# Patient Record
Sex: Female | Born: 1990 | Race: White | Hispanic: No | Marital: Single | Smoking: Current every day smoker
Health system: Southern US, Community
[De-identification: ages and names within clinical notes are randomized; demographics above are authoritative.]

## PROBLEM LIST (undated history)

## (undated) ENCOUNTER — Inpatient Hospital Stay (HOSPITAL_COMMUNITY): Payer: Self-pay

## (undated) DIAGNOSIS — O034 Incomplete spontaneous abortion without complication: Principal | ICD-10-CM

## (undated) DIAGNOSIS — F988 Other specified behavioral and emotional disorders with onset usually occurring in childhood and adolescence: Secondary | ICD-10-CM

## (undated) DIAGNOSIS — Z789 Other specified health status: Secondary | ICD-10-CM

## (undated) DIAGNOSIS — Z9889 Other specified postprocedural states: Secondary | ICD-10-CM

## (undated) HISTORY — DX: Incomplete spontaneous abortion without complication: O03.4

## (undated) HISTORY — DX: Other specified behavioral and emotional disorders with onset usually occurring in childhood and adolescence: F98.8

## (undated) HISTORY — PX: NO PAST SURGERIES: SHX2092

---

## 2012-02-11 ENCOUNTER — Inpatient Hospital Stay (HOSPITAL_COMMUNITY)
Admission: AD | Admit: 2012-02-11 | Discharge: 2012-02-11 | Disposition: A | Discharge status: Home / Self Care | Payer: Medicaid Other | Source: Ambulatory Visit | Attending: Obstetrics & Gynecology | Admitting: Obstetrics & Gynecology

## 2012-02-11 ENCOUNTER — Inpatient Hospital Stay (HOSPITAL_COMMUNITY): Payer: Medicaid Other

## 2012-02-11 ENCOUNTER — Encounter (HOSPITAL_COMMUNITY): Payer: Self-pay | Admitting: *Deleted

## 2012-02-11 DIAGNOSIS — O9989 Other specified diseases and conditions complicating pregnancy, childbirth and the puerperium: Secondary | ICD-10-CM

## 2012-02-11 DIAGNOSIS — R1032 Left lower quadrant pain: Secondary | ICD-10-CM | POA: Insufficient documentation

## 2012-02-11 DIAGNOSIS — R109 Unspecified abdominal pain: Secondary | ICD-10-CM

## 2012-02-11 DIAGNOSIS — O99891 Other specified diseases and conditions complicating pregnancy: Secondary | ICD-10-CM | POA: Insufficient documentation

## 2012-02-11 DIAGNOSIS — O26899 Other specified pregnancy related conditions, unspecified trimester: Secondary | ICD-10-CM

## 2012-02-11 HISTORY — DX: Other specified health status: Z78.9

## 2012-02-11 LAB — URINE MICROSCOPIC-ADD ON

## 2012-02-11 LAB — WET PREP, GENITAL
Clue Cells Wet Prep HPF POC: NONE SEEN
Trich, Wet Prep: NONE SEEN

## 2012-02-11 LAB — CBC
HCT: 35.3 % — ABNORMAL LOW (ref 36.0–46.0)
MCV: 85.9 fL (ref 78.0–100.0)
RBC: 4.11 MIL/uL (ref 3.87–5.11)
WBC: 10 10*3/uL (ref 4.0–10.5)

## 2012-02-11 LAB — URINALYSIS, ROUTINE W REFLEX MICROSCOPIC
Bilirubin Urine: NEGATIVE
Glucose, UA: NEGATIVE mg/dL
Specific Gravity, Urine: 1.015 (ref 1.005–1.030)
pH: 7.5 (ref 5.0–8.0)

## 2012-02-11 LAB — POCT PREGNANCY, URINE: Preg Test, Ur: POSITIVE — AB

## 2012-02-11 LAB — HCG, QUANTITATIVE, PREGNANCY: hCG, Beta Chain, Quant, S: 18698 m[IU]/mL — ABNORMAL HIGH (ref <5)

## 2012-02-11 NOTE — MAU Note (Signed)
Patient has a letter indicating [facility] talked with Dr. Su Hilt at [facility] and was instructed to come to MAU.

## 2012-02-11 NOTE — MAU Provider Note (Signed)
Chief Complaint: Abdominal Pain, Nausea and Emesis    SUBJECTIVE HPI: Heidi Delgado is a 22 y.o. G2P0010 at [redacted]w[redacted]d by LMP who [facility] where they did [place] and could not see fetal heart so sent here for further evaluation. Has had lower abdominal cramping pain for 2 weeks and nausea with occasional vomiting for a month. Two wks ago had episode of spotting dark blood.   Past Medical History  Diagnosis Date  . No pertinent past medical history    OB History    Grav Para Term Preterm Abortions TAB SAB Ect Mult Living   2              # Outc Date GA Lbr Len/2nd Wgt Sex Del Anes PTL Lv   1 CUR            2 GRA              Past Surgical History  Procedure Date  . No past surgeries    History   Social History  . Marital Status: Single    Spouse Name: N/A    Number of Children: N/A  . Years of Education: N/A   Occupational History  . Not on file.   Social History Main Topics  . Smoking status: Current Every Day Smoker -- 0.5 packs/day    Types: Cigarettes  . Smokeless tobacco: Not on file  . Alcohol Use: No  . Drug Use: No  . Sexually Active: Yes   Other Topics Concern  . Not on file   Social History Narrative  . No narrative on file   No current facility-administered medications on file prior to encounter.   No current outpatient prescriptions on file prior to encounter.   Allergies  Allergen Reactions  . Codeine Nausea And Vomiting    ROS: Pertinent items in HPI  OBJECTIVE Blood pressure 129/84, pulse 103, temperature 98.7 F (37.1 C), temperature source Oral, resp. rate 16, height 4' 11.5" (1.511 m), weight 105 lb 6.4 oz (47.809 kg), last menstrual period 12/05/2011, SpO2 100.00%. GENERAL: Well-developed, well-nourished female in no acute distress.  HEENT: Normocephalic HEART: normal rate RESP: normal effort ABDOMEN: Soft, non-tender EXTREMITIES: Nontender, no edema NEURO: Alert and oriented SPECULUM EXAM: NEFG, physiologic discharge,  no blood noted, cervix clean BIMANUAL: cervix L/C; uterus 4-6 wk size, no adnexal tenderness or masses  LAB RESULTS Results for orders placed during the hospital encounter of 02/11/12 (from the past 24 hour(s))  URINALYSIS, ROUTINE W REFLEX MICROSCOPIC     Status: Abnormal   Collection Time   02/11/12  5:45 PM      Component Value Range   Color, Urine YELLOW  YELLOW   APPearance CLOUDY (*) CLEAR   Specific Gravity, Urine 1.015  1.005 - 1.030   pH 7.5  5.0 - 8.0   Glucose, UA NEGATIVE  NEGATIVE mg/dL   Hgb urine dipstick TRACE (*) NEGATIVE   Bilirubin Urine NEGATIVE  NEGATIVE   Ketones, ur NEGATIVE  NEGATIVE mg/dL   Protein, ur NEGATIVE  NEGATIVE mg/dL   Urobilinogen, UA 0.2  0.0 - 1.0 mg/dL   Nitrite NEGATIVE  NEGATIVE   Leukocytes, UA NEGATIVE  NEGATIVE  URINE MICROSCOPIC-ADD ON     Status: Abnormal   Collection Time   02/11/12  5:45 PM      Component Value Range   Squamous Epithelial / LPF RARE  RARE   WBC, UA 0-2  <3 WBC/hpf   RBC / HPF 0-2  <  3 RBC/hpf   Bacteria, UA FEW (*) RARE  POCT PREGNANCY, URINE     Status: Abnormal   Collection Time   02/11/12  6:01 PM      Component Value Range   Preg Test, Ur POSITIVE (*) NEGATIVE  CBC     Status: Abnormal   Collection Time   02/11/12  6:35 PM      Component Value Range   WBC 10.0  4.0 - 10.5 K/uL   RBC 4.11  3.87 - 5.11 MIL/uL   Hemoglobin 12.0  12.0 - 15.0 g/dL   HCT 40.9 (*) 81.1 - 91.4 %   MCV 85.9  78.0 - 100.0 fL   MCH 29.2  26.0 - 34.0 pg   MCHC 34.0  30.0 - 36.0 g/dL   RDW 78.2  95.6 - 21.3 %   Platelets 202  150 - 400 K/uL  ABO/RH     Status: Normal (Preliminary result)   Collection Time   02/11/12  6:35 PM      Component Value Range   ABO/RH(D) A POS    HCG, QUANTITATIVE, PREGNANCY     Status: Abnormal   Collection Time   02/11/12  6:35 PM      Component Value Range   hCG, Beta Chain, Quant, S 08657 (*) <5 mIU/mL  WET PREP, GENITAL     Status: Abnormal   Collection Time   02/11/12  6:40 PM      Component Value  Range   Yeast Wet Prep HPF POC NONE SEEN  NONE SEEN   Trich, Wet Prep NONE SEEN  NONE SEEN   Clue Cells Wet Prep HPF POC NONE SEEN  NONE SEEN   WBC, Wet Prep HPF POC MANY (*) NONE SEEN    IMAGING   MAU COURSE  ASSESSMENT No diagnosis found.  PLAN Discharge home    Medication List     As of 02/11/2012  7:54 PM    ASK your doctor about these medications         acetaminophen 500 MG tablet   Commonly known as: TYLENOL   Take 500 mg by mouth every 6 (six) hours as needed. pain      ibuprofen 200 MG tablet   Commonly known as: ADVIL,MOTRIN   Take 200 mg by mouth every 6 (six) hours as needed. pain          Danae Orleans, CNM 02/11/2012  6:32 PM  Awaiting ultrasound results. Care assumed by [facility] FNP at 2000  *RADIOLOGY REPORT*  Clinical Data: Left lower quadrant pain.  OBSTETRIC <14 WK ULTRASOUND  Technique: Transabdominal ultrasound was performed for evaluation  of the gestation as well as the maternal uterus and adnexal  regions.  Comparison: None.  Intrauterine gestational sac: Visualized  Yolk sac: Visualized  Embryo: Visualized  Cardiac Activity: Not visualized  CRL: 3.3 mm 6 w 0 d [place] EDC: 10/06/2012  Maternal uterus/Adnexae:  Left-sided corpus luteum cyst.  IMPRESSION:  Early intrauterine pregnancy. Heart activity is not noted at the  present time which may be related to the early gestational age.  Correlation with serial beta HCGs and follow-up sonogram in 7-10  days is recommended (sooner if clinically indicated).   Follow up ultrasound in 2 weeks for viability, return sooner for problems.  Discussed findings with the patient and plan of care. Patient voices understanding.

## 2012-02-11 NOTE — MAU Note (Signed)
Patient states she she was seen [facility] today and had a positive pregnancy test. States she has been having left lower abdominal pain that radiates to the lower abdomen for about 2 weeks. Has had nausea with occasional vomiting for about 6 weeks but 3 episodes today. Has bleeding about 2 weeks ago but none now.

## 2012-02-11 NOTE — MAU Note (Signed)
C/o nausea since beginning of pregnancy. Reports abd pain and cramping on and off for the past 2 weeks. Went to [facility] told her to f/u here for hormone level.

## 2012-02-12 LAB — GC/CHLAMYDIA PROBE AMP
CT Probe RNA: NEGATIVE
GC Probe RNA: NEGATIVE

## 2012-04-22 ENCOUNTER — Encounter (HOSPITAL_COMMUNITY): Payer: Self-pay | Admitting: Emergency Medicine

## 2012-04-22 ENCOUNTER — Emergency Department (HOSPITAL_COMMUNITY)
Admission: EM | Admit: 2012-04-22 | Discharge: 2012-04-22 | Discharge status: Against Medical Advice | Payer: Self-pay | Attending: Emergency Medicine | Admitting: Emergency Medicine

## 2012-04-22 DIAGNOSIS — Y990 Civilian activity done for income or pay: Secondary | ICD-10-CM | POA: Insufficient documentation

## 2012-04-22 DIAGNOSIS — S0993XA Unspecified injury of face, initial encounter: Secondary | ICD-10-CM | POA: Insufficient documentation

## 2012-04-22 DIAGNOSIS — Y9289 Other specified places as the place of occurrence of the external cause: Secondary | ICD-10-CM | POA: Insufficient documentation

## 2012-04-22 DIAGNOSIS — S199XXA Unspecified injury of neck, initial encounter: Secondary | ICD-10-CM

## 2012-04-22 DIAGNOSIS — Y9389 Activity, other specified: Secondary | ICD-10-CM | POA: Insufficient documentation

## 2012-04-22 DIAGNOSIS — W010XXA Fall on same level from slipping, tripping and stumbling without subsequent striking against object, initial encounter: Secondary | ICD-10-CM | POA: Insufficient documentation

## 2012-04-22 NOTE — ED Notes (Signed)
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

## 2012-04-22 NOTE — ED Provider Notes (Signed)
The patient left the emergency department before myself or any of my assistants could perform an appropriate history and physical for this patient.  She left the emergency department without informing staff.  Lyanne Co, MD 04/22/12 [phone]

## 2013-08-30 ENCOUNTER — Encounter (HOSPITAL_COMMUNITY): Payer: Self-pay

## 2013-08-30 ENCOUNTER — Inpatient Hospital Stay (HOSPITAL_COMMUNITY): Payer: Medicaid Other

## 2013-08-30 ENCOUNTER — Inpatient Hospital Stay (HOSPITAL_COMMUNITY)
Admission: AD | Admit: 2013-08-30 | Discharge: 2013-08-30 | Disposition: A | Discharge status: Home / Self Care | Payer: Self-pay | Source: Ambulatory Visit | Attending: Obstetrics & Gynecology | Admitting: Obstetrics & Gynecology

## 2013-08-30 DIAGNOSIS — O4690 Antepartum hemorrhage, unspecified, unspecified trimester: Secondary | ICD-10-CM

## 2013-08-30 DIAGNOSIS — O209 Hemorrhage in early pregnancy, unspecified: Secondary | ICD-10-CM

## 2013-08-30 DIAGNOSIS — R109 Unspecified abdominal pain: Secondary | ICD-10-CM | POA: Insufficient documentation

## 2013-08-30 DIAGNOSIS — O26859 Spotting complicating pregnancy, unspecified trimester: Secondary | ICD-10-CM | POA: Insufficient documentation

## 2013-08-30 DIAGNOSIS — O99330 Smoking (tobacco) complicating pregnancy, unspecified trimester: Secondary | ICD-10-CM | POA: Insufficient documentation

## 2013-08-30 LAB — CBC
HCT: 39.2 % (ref 36.0–46.0)
HEMOGLOBIN: 13.9 g/dL (ref 12.0–15.0)
MCH: 29.6 pg (ref 26.0–34.0)
MCHC: 35.5 g/dL (ref 30.0–36.0)
MCV: 83.6 fL (ref 78.0–100.0)
PLATELETS: 163 10*3/uL (ref 150–400)
RBC: 4.69 MIL/uL (ref 3.87–5.11)
RDW: 12.9 % (ref 11.5–15.5)
WBC: 7.7 10*3/uL (ref 4.0–10.5)

## 2013-08-30 LAB — URINALYSIS, ROUTINE W REFLEX MICROSCOPIC
BILIRUBIN URINE: NEGATIVE
Glucose, UA: NEGATIVE mg/dL
Hgb urine dipstick: NEGATIVE
KETONES UR: NEGATIVE mg/dL
Leukocytes, UA: NEGATIVE
NITRITE: NEGATIVE
PH: 7.5 (ref 5.0–8.0)
Protein, ur: NEGATIVE mg/dL
Specific Gravity, Urine: 1.01 (ref 1.005–1.030)
Urobilinogen, UA: 0.2 mg/dL (ref 0.0–1.0)

## 2013-08-30 LAB — HCG, QUANTITATIVE, PREGNANCY: HCG, BETA CHAIN, QUANT, S: 92219 m[IU]/mL — AB (ref <5)

## 2013-08-30 LAB — WET PREP, GENITAL
Trich, Wet Prep: NONE SEEN
Yeast Wet Prep HPF POC: NONE SEEN

## 2013-08-30 LAB — POCT PREGNANCY, URINE: Preg Test, Ur: POSITIVE — AB

## 2013-08-30 NOTE — MAU Provider Note (Signed)
History     CSN: [id number]  Arrival date and time: 08/30/13 1424   First Provider Initiated Contact with Patient 08/30/13 1522      Chief Complaint  Patient presents with  . Vaginal Bleeding   HPI Heidi Delgado 23 y.o. G2P0 at [redacted]w[redacted]d presents to MAU with one month of intermittent spotting with cramping.  She took an HPT one month ago that was positive and shortly after that began having the described symptoms.  One week ago she had a PRT at [facility] Department.  The spotting is less now than previous.  She did notice a small amt yesterday but has not seen any today.  She had cramping yesterday also rated at 5/10.  Tylenol was helpful for this.  She notices it more with being still and less so when she is moving around.  She is having nausea which is worse after taking PNV and occasionally vomits with this.  OB History   Grav Para Term Preterm Abortions TAB SAB Ect Mult Living   2             OB history is significant for one miscarriage at around 10 weeks.    Past Medical History  Diagnosis Date  . No pertinent past medical history     Past Surgical History  Procedure Laterality Date  . No past surgeries      Family History  Problem Relation Age of Onset  . Other Neg Hx   . Cancer Other   . CAD Other   . Stroke Other   . Hypertension Other     History  Substance Use Topics  . Smoking status: Current Every Day Smoker -- 0.50 packs/day    Types: Cigarettes  . Smokeless tobacco: Not on file  . Alcohol Use: No     Comment: occ    Allergies:  Allergies  Allergen Reactions  . Codeine Shortness Of Breath and Nausea And Vomiting  . Ivp Dye [Iodinated Diagnostic Agents]     Prescriptions prior to admission  Medication Sig Dispense Refill  . acetaminophen (TYLENOL) 500 MG tablet Take 500 mg by mouth every 6 (six) hours as needed. pain      . Prenatal Vit-Fe Fumarate-FA (PRENATAL MULTIVITAMIN) TABS tablet Take 1 tablet by mouth daily at 12 noon.        Review of  Systems  Constitutional: Negative for fever and chills.  HENT: Positive for congestion. Negative for sore throat.   Respiratory: Negative for cough, shortness of breath and wheezing.   Cardiovascular: Negative for chest pain and palpitations.  Gastrointestinal: Positive for heartburn, nausea, vomiting and abdominal pain. Negative for diarrhea and constipation.  Genitourinary: Negative for dysuria, urgency and frequency.  Skin: Positive for itching. Negative for rash.  Neurological: Positive for weakness and headaches. Negative for dizziness, tingling and seizures.  Psychiatric/Behavioral: Negative for depression, suicidal ideas and substance abuse.   Physical Exam   Blood pressure 108/63, pulse 80, temperature 98.2 F (36.8 C), height  (1.549 m), weight 58.06 kg (128 lb), last menstrual period 06/12/2013, unknown if currently breastfeeding.  Physical Exam  Constitutional: She is oriented to person, place, and time. She appears well-developed and well-nourished. No distress.  HENT:  Head: Normocephalic and atraumatic.  Eyes: EOM are normal.  Neck: Normal range of motion.  Cardiovascular: Normal rate, regular rhythm and normal heart sounds.  Exam reveals no gallop and no friction rub.   No murmur heard. Respiratory: Breath sounds normal.  GI: Soft. Bowel sounds  are normal. She exhibits no distension. There is no tenderness.  Genitourinary:  Mod amt of thin, white, frothy discharge Cervix is pink, smooth No CMT, adnexal tenderness Uterus is gravid  Musculoskeletal: Normal range of motion.  Neurological: She is alert and oriented to person, place, and time.  Skin: Skin is warm and dry.  Psychiatric: She has a normal mood and affect.   Results for orders placed during the hospital encounter of 08/30/13 (from the past 24 hour(s))  URINALYSIS, ROUTINE W REFLEX MICROSCOPIC     Status: Abnormal   Collection Time    08/30/13  2:40 PM      Result Value Ref Range   Color, Urine  YELLOW  YELLOW   APPearance CLOUDY (*) CLEAR   Specific Gravity, Urine 1.010  1.005 - 1.030   pH 7.5  5.0 - 8.0   Glucose, UA NEGATIVE  NEGATIVE mg/dL   Hgb urine dipstick NEGATIVE  NEGATIVE   Bilirubin Urine NEGATIVE  NEGATIVE   Ketones, ur NEGATIVE  NEGATIVE mg/dL   Protein, ur NEGATIVE  NEGATIVE mg/dL   Urobilinogen, UA 0.2  0.0 - 1.0 mg/dL   Nitrite NEGATIVE  NEGATIVE   Leukocytes, UA NEGATIVE  NEGATIVE  POCT PREGNANCY, URINE     Status: Abnormal   Collection Time    08/30/13  2:45 PM      Result Value Ref Range   Preg Test, Ur POSITIVE (*) NEGATIVE  WET PREP, GENITAL     Status: Abnormal   Collection Time    08/30/13  3:35 PM      Result Value Ref Range   Yeast Wet Prep HPF POC NONE SEEN  NONE SEEN   Trich, Wet Prep NONE SEEN  NONE SEEN   Clue Cells Wet Prep HPF POC FEW (*) NONE SEEN   WBC, Wet Prep HPF POC MODERATE (*) NONE SEEN  CBC     Status: None   Collection Time    08/30/13  3:42 PM      Result Value Ref Range   WBC 7.7  4.0 - 10.5 K/uL   RBC 4.69  3.87 - 5.11 MIL/uL   Hemoglobin 13.9  12.0 - 15.0 g/dL   HCT 16.1  09.6 - 04.5 %   MCV 83.6  78.0 - 100.0 fL   MCH 29.6  26.0 - 34.0 pg   MCHC 35.5  30.0 - 36.0 g/dL   RDW 40.9  81.1 - 91.4 %   Platelets 163  150 - 400 K/uL  HCG, QUANTITATIVE, PREGNANCY     Status: Abnormal   Collection Time    08/30/13  3:42 PM      Result Value Ref Range   hCG, Beta Chain, Quant, S 92219 (*) <5 mIU/mL   US Ob Comp Less 14 Wks  08/30/2013   CLINICAL DATA:  Abnormal bleeding  EXAM: OBSTETRIC <14 WK ULTRASOUND  TECHNIQUE: Transabdominal ultrasound was performed for evaluation of the gestation as well as the maternal uterus and adnexal regions.  COMPARISON:  None.  FINDINGS: Intrauterine gestational sac: Visualized/normal in shape.  Yolk sac:  Visualized  Embryo:  Visualized  Cardiac Activity: Visualize  Heart Rate: 163 bpm  MSD:   mm    w     d  CRL:   32.3  mm   10 w 1 d                  [place] EDC: 03/27/2014  Maternal uterus/adnexae:  No subchorionic hemorrhage. No adnexal  or ovarian masses. No free fluid.  IMPRESSION: Ten week 1 day intrauterine pregnancy with fetal heart rate 163 beats per min. No acute maternal findings.   Electronically Signed   By: Charlett Nose M.D.   On: 08/30/2013 16:19    MAU Course  Procedures  U/S, CBC, HCG MDM Labs and u/s consistent with IUP progressing  Assessment and Plan  A: IUP, first trimester P: Discharge to home PNV daily Obtain [facility] asap Return to MAU for emergency  Bertram Denver 08/30/2013, 3:25 PM

## 2013-08-30 NOTE — MAU Note (Signed)
Patient reports she went to the clinic last week and was told she is around [redacted] weeks pregnant. She has been experiencing red or brown light spotting on and off for about a month. She is also experiencing occasional cramping and "shooting" pains in the pelvic region.

## 2013-08-30 NOTE — Discharge Instructions (Signed)

## 2013-08-31 LAB — GC/CHLAMYDIA PROBE AMP
CT Probe RNA: NEGATIVE
GC Probe RNA: NEGATIVE

## 2013-10-17 LAB — OB RESULTS CONSOLE GC/CHLAMYDIA
CHLAMYDIA, DNA PROBE: NEGATIVE
Gonorrhea: NEGATIVE

## 2013-10-17 LAB — OB RESULTS CONSOLE HEPATITIS B SURFACE ANTIGEN: HEP B S AG: NEGATIVE

## 2013-10-17 LAB — OB RESULTS CONSOLE ANTIBODY SCREEN: ANTIBODY SCREEN: NEGATIVE

## 2013-10-17 LAB — OB RESULTS CONSOLE HIV ANTIBODY (ROUTINE TESTING): HIV: NONREACTIVE

## 2013-10-17 LAB — OB RESULTS CONSOLE ABO/RH: RH Type: POSITIVE

## 2013-10-17 LAB — OB RESULTS CONSOLE RUBELLA ANTIBODY, IGM: Rubella: IMMUNE

## 2013-10-17 LAB — OB RESULTS CONSOLE RPR: RPR: NONREACTIVE

## 2013-10-18 ENCOUNTER — Other Ambulatory Visit: Payer: Self-pay | Admitting: Obstetrics and Gynecology

## 2013-10-18 DIAGNOSIS — N6320 Unspecified lump in the left breast, unspecified quadrant: Principal | ICD-10-CM

## 2013-10-18 DIAGNOSIS — N6325 Unspecified lump in the left breast, overlapping quadrants: Secondary | ICD-10-CM

## 2013-10-25 ENCOUNTER — Encounter (INDEPENDENT_AMBULATORY_CARE_PROVIDER_SITE_OTHER): Payer: Self-pay

## 2013-10-25 ENCOUNTER — Ambulatory Visit
Admission: RE | Admit: 2013-10-25 | Discharge: 2013-10-25 | Disposition: A | Discharge status: Home / Self Care | Payer: Medicaid Other | Source: Ambulatory Visit | Attending: Obstetrics and Gynecology | Admitting: Obstetrics and Gynecology

## 2013-10-25 DIAGNOSIS — N6325 Unspecified lump in the left breast, overlapping quadrants: Secondary | ICD-10-CM

## 2013-10-25 DIAGNOSIS — N6320 Unspecified lump in the left breast, unspecified quadrant: Principal | ICD-10-CM

## 2013-11-06 ENCOUNTER — Encounter (HOSPITAL_COMMUNITY): Payer: Self-pay

## 2014-01-05 NOTE — L&D Delivery Note (Signed)
Delivery Note Pt progressed to complete and pushed well.  At 11:46 PM a viable female was delivered via Vaginal, Spontaneous Delivery (Presentation: vtx; Occiput Anterior).  APGAR: 9, 9; weight pending.   Placenta status: Intact, Spontaneous.  Cord: 3 vessels with the following complications: None.  Anesthesia: Epidural  Episiotomy: None Lacerations: None Suture Repair: none Est. Blood Loss (mL): 300  Mom to postpartum.  Baby to Couplet care / Skin to Skin.  Karinna Beadles D 03/27/2014, 1:02 AM

## 2014-02-28 LAB — OB RESULTS CONSOLE GBS: GBS: POSITIVE

## 2014-03-21 ENCOUNTER — Encounter (HOSPITAL_COMMUNITY): Payer: Self-pay | Admitting: *Deleted

## 2014-03-21 ENCOUNTER — Telehealth (HOSPITAL_COMMUNITY): Payer: Self-pay | Admitting: *Deleted

## 2014-03-21 NOTE — Telephone Encounter (Signed)
Preadmission screen  

## 2014-03-26 ENCOUNTER — Inpatient Hospital Stay (HOSPITAL_COMMUNITY): Payer: Medicaid Other | Admitting: Anesthesiology

## 2014-03-26 ENCOUNTER — Encounter (HOSPITAL_COMMUNITY): Payer: Self-pay | Admitting: *Deleted

## 2014-03-26 ENCOUNTER — Inpatient Hospital Stay (HOSPITAL_COMMUNITY)
Admission: AD | Admit: 2014-03-26 | Discharge: 2014-03-28 | DRG: 775 | Disposition: A | Discharge status: Home / Self Care | Payer: Medicaid Other | Source: Ambulatory Visit | Attending: Obstetrics and Gynecology | Admitting: Obstetrics and Gynecology

## 2014-03-26 DIAGNOSIS — O99824 Streptococcus B carrier state complicating childbirth: Principal | ICD-10-CM | POA: Diagnosis present

## 2014-03-26 DIAGNOSIS — Z3A39 39 weeks gestation of pregnancy: Secondary | ICD-10-CM | POA: Diagnosis present

## 2014-03-26 DIAGNOSIS — Z3483 Encounter for supervision of other normal pregnancy, third trimester: Secondary | ICD-10-CM | POA: Diagnosis present

## 2014-03-26 DIAGNOSIS — O4290 Premature rupture of membranes, unspecified as to length of time between rupture and onset of labor, unspecified weeks of gestation: Secondary | ICD-10-CM | POA: Diagnosis present

## 2014-03-26 LAB — CBC
HEMATOCRIT: 40.9 % (ref 36.0–46.0)
Hemoglobin: 14 g/dL (ref 12.0–15.0)
MCH: 28.7 pg (ref 26.0–34.0)
MCHC: 34.2 g/dL (ref 30.0–36.0)
MCV: 83.8 fL (ref 78.0–100.0)
Platelets: 136 10*3/uL — ABNORMAL LOW (ref 150–400)
RBC: 4.88 MIL/uL (ref 3.87–5.11)
RDW: 13.8 % (ref 11.5–15.5)
WBC: 10.7 10*3/uL — ABNORMAL HIGH (ref 4.0–10.5)

## 2014-03-26 LAB — TYPE AND SCREEN
ABO/RH(D): A POS
Antibody Screen: NEGATIVE

## 2014-03-26 LAB — POCT FERN TEST: POCT FERN TEST: POSITIVE

## 2014-03-26 MED ORDER — EPHEDRINE 5 MG/ML INJ
10.0000 mg | INTRAVENOUS | Status: DC | PRN
  Filled 2014-03-26: qty 2

## 2014-03-26 MED ORDER — DIPHENHYDRAMINE HCL 50 MG/ML IJ SOLN: 12.5000 mg | INTRAMUSCULAR | Status: DC | PRN

## 2014-03-26 MED ORDER — CITRIC ACID-SODIUM CITRATE 334-500 MG/5ML PO SOLN: 30.0000 mL | ORAL | Status: DC | PRN

## 2014-03-26 MED ORDER — BUTORPHANOL TARTRATE 1 MG/ML IJ SOLN
1.0000 mg | Freq: Once | INTRAMUSCULAR | Status: AC
  Administered 2014-03-26: 1 mg via INTRAVENOUS
  Filled 2014-03-26: qty 1

## 2014-03-26 MED ORDER — LACTATED RINGERS IV SOLN: 500.0000 mL | Freq: Once | INTRAVENOUS | Status: DC

## 2014-03-26 MED ORDER — BUTORPHANOL TARTRATE 1 MG/ML IJ SOLN: 1.0000 mg | INTRAMUSCULAR | Status: DC | PRN

## 2014-03-26 MED ORDER — OXYTOCIN 40 UNITS IN LACTATED RINGERS INFUSION - SIMPLE MED: 62.5000 mL/h | INTRAVENOUS | Status: DC

## 2014-03-26 MED ORDER — DEXTROSE 5 % IV SOLN
2.5000 10*6.[IU] | INTRAVENOUS | Status: DC
  Administered 2014-03-26 (×3): 2.5 10*6.[IU] via INTRAVENOUS
  Filled 2014-03-26 (×7): qty 2.5

## 2014-03-26 MED ORDER — LACTATED RINGERS IV SOLN: 500.0000 mL | INTRAVENOUS | Status: DC | PRN

## 2014-03-26 MED ORDER — FENTANYL 2.5 MCG/ML BUPIVACAINE 1/10 % EPIDURAL INFUSION (WH - ANES)
14.0000 mL/h | INTRAMUSCULAR | Status: DC | PRN
  Administered 2014-03-26 (×3): 14 mL/h via EPIDURAL
  Filled 2014-03-26 (×2): qty 125

## 2014-03-26 MED ORDER — TERBUTALINE SULFATE 1 MG/ML IJ SOLN: 0.2500 mg | Freq: Once | INTRAMUSCULAR | Status: AC | PRN

## 2014-03-26 MED ORDER — LACTATED RINGERS IV SOLN
INTRAVENOUS | Status: DC
  Administered 2014-03-26 (×2): via INTRAVENOUS

## 2014-03-26 MED ORDER — OXYCODONE-ACETAMINOPHEN 5-325 MG PO TABS: 2.0000 | ORAL_TABLET | ORAL | Status: DC | PRN

## 2014-03-26 MED ORDER — LIDOCAINE HCL (PF) 1 % IJ SOLN
INTRAMUSCULAR | Status: DC | PRN
  Administered 2014-03-26 (×2): 5 mL

## 2014-03-26 MED ORDER — PHENYLEPHRINE 40 MCG/ML (10ML) SYRINGE FOR IV PUSH (FOR BLOOD PRESSURE SUPPORT)
80.0000 ug | PREFILLED_SYRINGE | INTRAVENOUS | Status: DC | PRN
  Filled 2014-03-26: qty 2

## 2014-03-26 MED ORDER — BUTORPHANOL TARTRATE 1 MG/ML IJ SOLN
2.0000 mg | Freq: Once | INTRAMUSCULAR | Status: AC
  Administered 2014-03-26: 2 mg via INTRAVENOUS
  Filled 2014-03-26: qty 2

## 2014-03-26 MED ORDER — OXYTOCIN 40 UNITS IN LACTATED RINGERS INFUSION - SIMPLE MED
1.0000 m[IU]/min | INTRAVENOUS | Status: DC
  Administered 2014-03-26: 2 m[IU]/min via INTRAVENOUS
  Filled 2014-03-26: qty 1000

## 2014-03-26 MED ORDER — LIDOCAINE HCL (PF) 1 % IJ SOLN
30.0000 mL | INTRAMUSCULAR | Status: DC | PRN
Start: 2014-03-26 — End: 2014-03-27
  Filled 2014-03-26: qty 30

## 2014-03-26 MED ORDER — PENICILLIN G POTASSIUM 5000000 UNITS IJ SOLR
5.0000 10*6.[IU] | Freq: Once | INTRAVENOUS | Status: AC
  Administered 2014-03-26: 5 10*6.[IU] via INTRAVENOUS
  Filled 2014-03-26: qty 5

## 2014-03-26 MED ORDER — PHENYLEPHRINE 40 MCG/ML (10ML) SYRINGE FOR IV PUSH (FOR BLOOD PRESSURE SUPPORT)
80.0000 ug | PREFILLED_SYRINGE | INTRAVENOUS | Status: DC | PRN
  Filled 2014-03-26: qty 20
  Filled 2014-03-26: qty 2

## 2014-03-26 MED ORDER — OXYCODONE-ACETAMINOPHEN 5-325 MG PO TABS: 1.0000 | ORAL_TABLET | ORAL | Status: DC | PRN

## 2014-03-26 MED ORDER — OXYTOCIN BOLUS FROM INFUSION
500.0000 mL | INTRAVENOUS | Status: DC
  Administered 2014-03-26: 500 mL via INTRAVENOUS

## 2014-03-26 MED ORDER — ONDANSETRON HCL 4 MG/2ML IJ SOLN
4.0000 mg | Freq: Four times a day (QID) | INTRAMUSCULAR | Status: DC | PRN
  Administered 2014-03-26: 4 mg via INTRAVENOUS
  Filled 2014-03-26: qty 2

## 2014-03-26 NOTE — Progress Notes (Signed)
Comfortable with epidural Afeb, VSS FHT- Cat I, ctx q 1-3 min on 14 mu/min pitocin VE-3/80/-1, vtx Continue pitocin and PCN, monitor progress

## 2014-03-26 NOTE — Anesthesia Preprocedure Evaluation (Signed)

## 2014-03-26 NOTE — H&P (Signed)
Heidi Delgado is a 24 y.o. female, G2 P0010, EGA 39+ weeks with EDC 3-22 presenting for eval of leaking fluid since about 0730.  Eval in MAU confirmed ROM, irreg ctx, pt admitted and started on pitocin and PCN.  Prenatal care essentially uncomplicated, see prenatal records for complete history.  Maternal Medical History:  Reason for admission: Rupture of membranes and contractions.   Contractions: Frequency: irregular.   Perceived severity is mild.    Fetal activity: Perceived fetal activity is normal.    Prenatal complications: no prenatal complications   OB History    Gravida Para Term Preterm AB TAB SAB Ectopic Multiple Living   2    1  1         Past Medical History  Diagnosis Date  . No pertinent past medical history   . ADD (attention deficit disorder)    Past Surgical History  Procedure Laterality Date  . No past surgeries     Family History: family history includes CAD in her other; Cancer in her other; Hypertension in her other; Stroke in her other. There is no history of Other. Social History:  reports that she quit smoking about 5 months ago. She does not have any smokeless tobacco history on file. She reports that she does not drink alcohol or use illicit drugs.   Prenatal Transfer Tool  Maternal Diabetes: No Genetic Screening: Normal Maternal Ultrasounds/Referrals: Normal Fetal Ultrasounds or other Referrals:  None Maternal Substance Abuse:  No Significant Maternal Medications:  None Significant Maternal Lab Results:  Lab values include: Group B Strep positive Other Comments:  None  Review of Systems  Respiratory: Negative.   Cardiovascular: Negative.     Dilation: Closed Effacement (%): 50 Station: -2 Exam by:: lee Blood pressure 112/69, pulse 80, temperature 98.1 F (36.7 C), temperature source Oral, resp. rate 18, height 5' (1.524 m), weight 78.019 kg (172 lb), last menstrual period 06/12/2013, unknown if currently breastfeeding. Maternal Exam:   Uterine Assessment: Contraction strength is mild.  Contraction frequency is irregular.   Abdomen: Patient reports no abdominal tenderness. Estimated fetal weight is 7 lbs.   Fetal presentation: vertex  Introitus: Normal vulva. Normal vagina.  Ferning test: positive.  Amniotic fluid character: clear.  Pelvis: adequate for delivery.   Cervix: Cervix evaluated by digital exam.     Fetal Exam Fetal Monitor Review: Mode: ultrasound.   Variability: moderate (6-25 bpm).   Pattern: accelerations present and no decelerations.    Fetal State Assessment: Category I - tracings are normal.     Physical Exam  Vitals reviewed. Constitutional: She appears well-developed and well-nourished.  Cardiovascular: Normal rate, regular rhythm and normal heart sounds.   No murmur heard. Respiratory: Effort normal and breath sounds normal. No respiratory distress. She has no wheezes.    Prenatal labs: ABO, Rh: A/Positive/-- (10/13 0000) Antibody: Negative (10/13 0000) Rubella: Immune (10/13 0000) RPR: Nonreactive (10/13 0000)  HBsAg: Negative (10/13 0000)  HIV: Non-reactive (10/13 0000)  GBS: Positive (02/24 0000)   Assessment/Plan: IUP at 39 weeks with ROM, irreg ctx, cervix not dilated.  Pt admitted, on pitocin augmentation, on PCN for GBS prophylaxis.  Will monitor progress, discussed this may be a lengthy process.     Rodriquez Thorner D 03/26/2014, 1:10 PM

## 2014-03-26 NOTE — MAU Note (Signed)
Pt states she has had irregular uc's during the night, no bleeding, started having gushes of fluid 1 1/2 hours ago, clear.  Pt had wet pants on arrival to MAU, clear fluid noted in bathroom floor.

## 2014-03-26 NOTE — Anesthesia Procedure Notes (Signed)
Epidural Patient location during procedure: OB Start time: 03/26/2014 3:17 PM  Staffing Anesthesiologist: Brayton CavesJACKSON, Heidi Delgado Performed by: anesthesiologist   Preanesthetic Checklist Completed: patient identified, site marked, surgical consent, pre-op evaluation, timeout performed, IV checked, risks and benefits discussed and monitors and equipment checked  Epidural Patient position: sitting Prep: site prepped and draped and DuraPrep Patient monitoring: continuous pulse ox and blood pressure Approach: midline Location: L3-L4 Injection technique: LOR air  Needle:  Needle type: Tuohy  Needle gauge: 17 G Needle length: 9 cm and 9 Needle insertion depth: 5 cm cm Catheter type: closed end flexible Catheter size: 19 Gauge Catheter at skin depth: 10 cm Test dose: negative  Assessment Events: blood not aspirated, injection not painful, no injection resistance, negative IV test and no paresthesia  Additional Notes Patient identified.  Risk benefits discussed including failed block, incomplete pain control, headache, nerve damage, paralysis, blood pressure changes, nausea, vomiting, reactions to medication both toxic or allergic, and postpartum back pain.  Patient expressed understanding and wished to proceed.  All questions were answered.  Sterile technique used throughout procedure and epidural site dressed with sterile barrier dressing. No paresthesia or other complications noted.The patient did not experience any signs of intravascular injection such as tinnitus or metallic taste in mouth nor signs of intrathecal spread such as rapid motor block. Please see nursing notes for vital signs.

## 2014-03-26 NOTE — Plan of Care (Signed)
Problem: Consults Goal: Birthing Suites Patient Information Press F2 to bring up selections list  Outcome: Completed/Met Date Met:  03/26/14  Pt > [redacted] weeks EGA and Other (specify with a note) SROM.

## 2014-03-27 ENCOUNTER — Encounter (HOSPITAL_COMMUNITY): Payer: Self-pay

## 2014-03-27 LAB — RPR: RPR Ser Ql: NONREACTIVE

## 2014-03-27 MED ORDER — SIMETHICONE 80 MG PO CHEW: 80.0000 mg | CHEWABLE_TABLET | ORAL | Status: DC | PRN

## 2014-03-27 MED ORDER — TETANUS-DIPHTH-ACELL PERTUSSIS 5-2.5-18.5 LF-MCG/0.5 IM SUSP: 0.5000 mL | Freq: Once | INTRAMUSCULAR | Status: DC

## 2014-03-27 MED ORDER — WITCH HAZEL-GLYCERIN EX PADS: 1.0000 "application " | MEDICATED_PAD | CUTANEOUS | Status: DC | PRN

## 2014-03-27 MED ORDER — METHYLERGONOVINE MALEATE 0.2 MG/ML IJ SOLN: 0.2000 mg | INTRAMUSCULAR | Status: DC | PRN

## 2014-03-27 MED ORDER — ONDANSETRON HCL 4 MG PO TABS: 4.0000 mg | ORAL_TABLET | ORAL | Status: DC | PRN

## 2014-03-27 MED ORDER — BENZOCAINE-MENTHOL 20-0.5 % EX AERO: 1.0000 "application " | INHALATION_SPRAY | CUTANEOUS | Status: DC | PRN

## 2014-03-27 MED ORDER — MAGNESIUM HYDROXIDE 400 MG/5ML PO SUSP: 30.0000 mL | ORAL | Status: DC | PRN

## 2014-03-27 MED ORDER — LANOLIN HYDROUS EX OINT: TOPICAL_OINTMENT | CUTANEOUS | Status: DC | PRN

## 2014-03-27 MED ORDER — DIPHENHYDRAMINE HCL 25 MG PO CAPS: 25.0000 mg | ORAL_CAPSULE | Freq: Four times a day (QID) | ORAL | Status: DC | PRN

## 2014-03-27 MED ORDER — ZOLPIDEM TARTRATE 5 MG PO TABS: 5.0000 mg | ORAL_TABLET | Freq: Every evening | ORAL | Status: DC | PRN

## 2014-03-27 MED ORDER — SENNOSIDES-DOCUSATE SODIUM 8.6-50 MG PO TABS
2.0000 | ORAL_TABLET | ORAL | Status: DC
  Filled 2014-03-27: qty 2

## 2014-03-27 MED ORDER — MEASLES, MUMPS & RUBELLA VAC ~~LOC~~ INJ
0.5000 mL | INJECTION | Freq: Once | SUBCUTANEOUS | Status: DC
  Filled 2014-03-27: qty 0.5

## 2014-03-27 MED ORDER — METHYLERGONOVINE MALEATE 0.2 MG PO TABS: 0.2000 mg | ORAL_TABLET | ORAL | Status: DC | PRN

## 2014-03-27 MED ORDER — ACETAMINOPHEN 325 MG PO TABS
650.0000 mg | ORAL_TABLET | ORAL | Status: DC | PRN
  Administered 2014-03-27: 650 mg via ORAL
  Filled 2014-03-27: qty 2

## 2014-03-27 MED ORDER — ONDANSETRON HCL 4 MG/2ML IJ SOLN
4.0000 mg | INTRAMUSCULAR | Status: DC | PRN
Start: 2014-03-27 — End: 2014-03-28

## 2014-03-27 MED ORDER — DIBUCAINE 1 % RE OINT: 1.0000 "application " | TOPICAL_OINTMENT | RECTAL | Status: DC | PRN

## 2014-03-27 MED ORDER — PRENATAL MULTIVITAMIN CH
1.0000 | ORAL_TABLET | Freq: Every day | ORAL | Status: DC
  Administered 2014-03-27 – 2014-03-28 (×2): 1 via ORAL
  Filled 2014-03-27 (×2): qty 1

## 2014-03-27 MED ORDER — OXYCODONE-ACETAMINOPHEN 5-325 MG PO TABS: 2.0000 | ORAL_TABLET | ORAL | Status: DC | PRN

## 2014-03-27 MED ORDER — IBUPROFEN 600 MG PO TABS
600.0000 mg | ORAL_TABLET | Freq: Four times a day (QID) | ORAL | Status: DC
  Administered 2014-03-27 – 2014-03-28 (×6): 600 mg via ORAL
  Filled 2014-03-27 (×6): qty 1

## 2014-03-27 MED ORDER — OXYCODONE-ACETAMINOPHEN 5-325 MG PO TABS: 1.0000 | ORAL_TABLET | ORAL | Status: DC | PRN

## 2014-03-27 NOTE — Progress Notes (Signed)
PPD #1 No problems Afeb, VSS Fundus firm, NT at U-1 Continue routine postpartum care 

## 2014-03-27 NOTE — Lactation Note (Addendum)
This note was copied from the chart of Heidi Delgado. Lactation Consultation Note  Patient Name: Heidi Pauline Goodnita Zelman ZOXWR'UToday's Date: 03/27/2014 Reason for consult: Follow-up assessment  Baby is 4418 hours old . 2nd LC visit . MBU RN assessed baby and she woke up. MBU RN assisted  Mom with latch and positioning. LC observed the feeding and noted multiply swallows and increased with breast compressions. Baby released at 13 mins , and still rooting so LC assisted with positioning and re- latched the Baby in football position. Increased  swallows noted and per mom the latch felt more comfortable. Baby still feeding at 5 mins.( LC also observed the previous latch )  LC reviewed basics. Breast massage , hand express, Before  latching and skin to skin feedings until the baby can stay awake for a feeding.    Maternal Data Has patient been taught Hand Expression?: Yes  Feeding Feeding Type: Breast Fed Length of feed:  (multiply swallows noted , per mom latched better , more comfortable )  LATCH Score/Interventions Latch: Grasps breast easily, tongue down, lips flanged, rhythmical sucking. Intervention(s): Adjust position;Assist with latch;Breast compression  Audible Swallowing: Spontaneous and intermittent Intervention(s): Hand expression  Type of Nipple: Everted at rest and after stimulation  Comfort (Breast/Nipple): Soft / non-tender     Hold (Positioning): Assistance needed to correctly position infant at breast and maintain latch. (with depth and positioning ) Intervention(s): Breastfeeding basics reviewed;Support Pillows;Position options;Skin to skin  LATCH Score: 9  Lactation Tools Discussed/Used WIC Program: No (per mom )   Consult Status Consult Status: Follow-up Date: 03/28/14 Follow-up type: In-patient    Kathrin Greathouseorio, Mitsuo Budnick Ann 03/27/2014, 6:03 PM

## 2014-03-27 NOTE — Lactation Note (Signed)
This note was copied from the chart of Heidi Pauline Goodnita Payeur. Lactation Consultation Note  Patient Name: Heidi Delgado Today's Date: 03/27/2014 Reason for consult: Initial assessment (mom encouraged to page with feeding cues )  Baby is 17 hours old , has been to the breast x 5 10 -15 mins , and attempts. Per mom last tried at 3 pm and the baby was sleepy.  1wet , 5 mec stools. LS - 6-8.  Mother informed of post-discharge support and given phone number to the lactation department,  including services for phone call assistance; out-patient appointments; and breastfeeding support group.  List of other breastfeeding resources in the community given in the handout. Encouraged mother to call  for problems or concerns related to breastfeeding.Mother informed of post-discharge support and given  phone number to the lactation department, including services for phone call assistance; out-patient appointments;  and breastfeeding support group. List of other breastfeeding resources in the community given in the handout.  Encouraged mother to call for problems or concerns related to breastfeeding.   Maternal Data    Feeding Feeding Type:  (per mom last attempted at 1530 , baby sleepy ) Length of feed: 0 min  LATCH Score/Interventions                Intervention(s): Breastfeeding basics reviewed     Lactation Tools Discussed/Used WIC Program: No (per mom )   Consult Status Consult Status: Follow-up Date: 03/27/14 Follow-up type: In-patient    Kathrin Greathouseorio, Jonathan Corpus Ann 03/27/2014, 5:05 PM

## 2014-03-27 NOTE — Progress Notes (Signed)
UR chart review completed.  

## 2014-03-27 NOTE — Anesthesia Postprocedure Evaluation (Signed)
Anesthesia Post Note  Patient: Heidi Delgado  Procedure(s) Performed: * No procedures listed *  Anesthesia type: Epidural  Patient location: Mother/Baby  Post pain: Pain level controlled  Post assessment: Post-op Vital signs reviewed  Last Vitals:  Filed Vitals:   03/27/14 0640  BP: 128/69  Pulse: 90  Temp: 37.1 C  Resp: 18    Post vital signs: Reviewed  Level of consciousness:alert  Complications: No apparent anesthesia complications

## 2014-03-27 NOTE — Progress Notes (Signed)
Patient tearful and anxious about breastfeeding and difficulties with Latch. Infant demonstrating late feeding cues, but easily consoled. Respositioned patient for comfort and to facilitate side lying latch. Offered comfort and emotional support with a good result. Patient was able to stop crying and participate in breastfeeding event. Father of baby here, and observed how to coach and support the patients breastfeeding needs at a later time. Infant latched for >15 minutes, patient verbalized comfort, less anxiety.

## 2014-03-28 MED ORDER — IBUPROFEN 600 MG PO TABS: 600.0000 mg | ORAL_TABLET | Freq: Four times a day (QID) | ORAL | Status: DC

## 2014-03-28 NOTE — Lactation Note (Signed)
This note was copied from the chart of Heidi Delgado Frangos. Lactation Consultation Note  Baby latched in football hold upon entering. Mother independently latched baby. Sucks and some swallows observed. Encouraged mother to massage her breast to keep baby active. Discussed how to unlatch, switching side and burping, cluster feeding. Reviewed engorgement care and Baby & Me booklet pp 24&25 to monitor voids/stools. Encouraged mother to attend support group and provided her w/ comfort gels. Mom encouraged to feed baby 8-12 times/24 hours and with feeding cues.    Patient Name: Heidi Delgado Obryan ZOXWR'UToday's Date: 03/28/2014 Reason for consult: Follow-up assessment   Maternal Data    Feeding Feeding Type: Breast Fed Length of feed: 20 min  LATCH Score/Interventions Latch: Grasps breast easily, tongue down, lips flanged, rhythmical sucking. (latched upon entering) Intervention(s): Breast massage  Audible Swallowing: A few with stimulation Intervention(s): Skin to skin;Hand expression Intervention(s): Alternate breast massage;Skin to skin  Type of Nipple: Everted at rest and after stimulation  Comfort (Breast/Nipple): Filling, red/small blisters or bruises, mild/mod discomfort  Problem noted: Mild/Moderate discomfort Interventions (Mild/moderate discomfort): Comfort gels  Hold (Positioning): No assistance needed to correctly position infant at breast. Intervention(s): Breastfeeding basics reviewed;Support Pillows;Skin to skin  LATCH Score: 8  Lactation Tools Discussed/Used     Consult Status Consult Status: Follow-up Date: 03/29/14 Follow-up type: In-patient    Dahlia ByesBerkelhammer, [facility] 03/28/2014, 10:23 AM

## 2014-03-28 NOTE — Progress Notes (Signed)
PPD #2 Doing well Afeb, VSS Fundus firm D/c home 

## 2014-03-28 NOTE — Discharge Summary (Signed)
Obstetric Discharge Summary Reason for Admission: rupture of membranes Prenatal Procedures: none Intrapartum Procedures: spontaneous vaginal delivery Postpartum Procedures: none Complications-Operative and Postpartum: none HEMOGLOBIN  Date Value Ref Range Status  03/26/2014 14.0 12.0 - 15.0 g/dL Final   HCT  Date Value Ref Range Status  03/26/2014 40.9 36.0 - 46.0 % Final    Physical Exam:  General: alert Lochia: appropriate Uterine Fundus: firm   Discharge Diagnoses: Term Pregnancy-delivered  Discharge Information: Date: 03/28/2014 Activity: pelvic rest Diet: routine Medications: Ibuprofen Condition: stable Instructions: refer to practice specific booklet Discharge to: home Follow-up Information    Follow up with Jullian Clayson D, MD. Schedule an appointment as soon as possible for a visit in 6 weeks.   Specialty:  Obstetrics and Gynecology   Contact information:   [address], [address] [place] [address] [phone]       Newborn Data: Live born female  Birth Weight: 7 lb 5.5 oz (3330 g) APGAR: 9, 9  Home with mother.  Zeriyah Wain D 03/28/2014, 8:09 AM

## 2014-03-28 NOTE — Progress Notes (Signed)
Admission nutrition screen triggered for unintentional weight loss > 10 lbs within the last month. PNR indicates a 5 lb difference in weights 3/15-3/21 and an overall weight gain of 36 lbs, which would be > IOM recommendations. Patients chart reviewed and assessed  for nutritional risk. Patient is determined to be at low nutrition  risk.   Elisabeth CaraKatherine Raney Antwine M.Odis LusterEd. R.D. LDN Neonatal Nutrition Support Specialist/RD III Pager [phone]

## 2014-03-28 NOTE — Discharge Instructions (Signed)
As per discharge pamphlet °

## 2014-04-02 ENCOUNTER — Inpatient Hospital Stay (HOSPITAL_COMMUNITY): Admission: RE | Admit: 2014-04-02 | Payer: Medicaid Other | Source: Ambulatory Visit

## 2017-10-20 ENCOUNTER — Encounter: Payer: Self-pay | Admitting: Obstetrics and Gynecology

## 2017-10-20 ENCOUNTER — Other Ambulatory Visit: Payer: Self-pay

## 2017-10-20 ENCOUNTER — Ambulatory Visit (HOSPITAL_COMMUNITY)
Admission: AD | Admit: 2017-10-20 | Discharge: 2017-10-20 | Disposition: A | Discharge status: Home / Self Care | Payer: Medicaid Other | Source: Ambulatory Visit | Attending: Obstetrics and Gynecology | Admitting: Obstetrics and Gynecology

## 2017-10-20 ENCOUNTER — Inpatient Hospital Stay (HOSPITAL_COMMUNITY): Payer: Medicaid Other | Admitting: Certified Registered Nurse Anesthetist

## 2017-10-20 ENCOUNTER — Encounter (HOSPITAL_COMMUNITY)
Admission: AD | Disposition: A | Discharge status: Home / Self Care | Payer: Self-pay | Source: Ambulatory Visit | Attending: Obstetrics and Gynecology

## 2017-10-20 ENCOUNTER — Other Ambulatory Visit: Payer: Self-pay | Admitting: Obstetrics and Gynecology

## 2017-10-20 ENCOUNTER — Encounter (HOSPITAL_COMMUNITY): Payer: Self-pay | Admitting: *Deleted

## 2017-10-20 DIAGNOSIS — Z9889 Other specified postprocedural states: Secondary | ICD-10-CM

## 2017-10-20 DIAGNOSIS — Z91041 Radiographic dye allergy status: Secondary | ICD-10-CM | POA: Diagnosis not present

## 2017-10-20 DIAGNOSIS — F988 Other specified behavioral and emotional disorders with onset usually occurring in childhood and adolescence: Secondary | ICD-10-CM | POA: Diagnosis not present

## 2017-10-20 DIAGNOSIS — Z8249 Family history of ischemic heart disease and other diseases of the circulatory system: Secondary | ICD-10-CM | POA: Diagnosis not present

## 2017-10-20 DIAGNOSIS — O021 Missed abortion: Secondary | ICD-10-CM | POA: Insufficient documentation

## 2017-10-20 DIAGNOSIS — Z87891 Personal history of nicotine dependence: Secondary | ICD-10-CM | POA: Insufficient documentation

## 2017-10-20 DIAGNOSIS — Z885 Allergy status to narcotic agent status: Secondary | ICD-10-CM | POA: Insufficient documentation

## 2017-10-20 DIAGNOSIS — O034 Incomplete spontaneous abortion without complication: Secondary | ICD-10-CM

## 2017-10-20 HISTORY — DX: Other specified postprocedural states: Z98.890

## 2017-10-20 HISTORY — PX: DILATION AND EVACUATION: SHX1459

## 2017-10-20 HISTORY — DX: Incomplete spontaneous abortion without complication: O03.4

## 2017-10-20 LAB — CBC
HCT: 41.2 % (ref 36.0–46.0)
HEMOGLOBIN: 14.3 g/dL (ref 12.0–15.0)
MCH: 29.1 pg (ref 26.0–34.0)
MCHC: 34.7 g/dL (ref 30.0–36.0)
MCV: 83.9 fL (ref 80.0–100.0)
PLATELETS: 193 10*3/uL (ref 150–400)
RBC: 4.91 MIL/uL (ref 3.87–5.11)
RDW: 12.8 % (ref 11.5–15.5)
WBC: 11.1 10*3/uL — AB (ref 4.0–10.5)
nRBC: 0 % (ref 0.0–0.2)

## 2017-10-20 LAB — TYPE AND SCREEN
ABO/RH(D): A POS
Antibody Screen: NEGATIVE

## 2017-10-20 SURGERY — DILATION AND EVACUATION, UTERUS
Anesthesia: General | Site: Vagina

## 2017-10-20 MED ORDER — MEPERIDINE HCL 25 MG/ML IJ SOLN: 6.2500 mg | INTRAMUSCULAR | Status: DC | PRN

## 2017-10-20 MED ORDER — OXYCODONE HCL 5 MG/5ML PO SOLN: 5.0000 mg | Freq: Once | ORAL | Status: DC | PRN

## 2017-10-20 MED ORDER — LIDOCAINE HCL (CARDIAC) PF 100 MG/5ML IV SOSY
PREFILLED_SYRINGE | INTRAVENOUS | Status: DC | PRN
  Administered 2017-10-20: 100 mg via INTRAVENOUS

## 2017-10-20 MED ORDER — ONDANSETRON HCL 4 MG/2ML IJ SOLN: 4.0000 mg | Freq: Once | INTRAMUSCULAR | Status: DC | PRN

## 2017-10-20 MED ORDER — SODIUM CHLORIDE 0.9 % IV SOLN
100.0000 mg | Freq: Once | INTRAVENOUS | Status: AC
  Administered 2017-10-20: 100 mg via INTRAVENOUS
  Filled 2017-10-20: qty 100

## 2017-10-20 MED ORDER — LACTATED RINGERS IV BOLUS
1000.0000 mL | Freq: Once | INTRAVENOUS | Status: AC
  Administered 2017-10-20: 1000 mL via INTRAVENOUS

## 2017-10-20 MED ORDER — FENTANYL CITRATE (PF) 100 MCG/2ML IJ SOLN
INTRAMUSCULAR | Status: DC | PRN
  Administered 2017-10-20 (×2): 50 ug via INTRAVENOUS

## 2017-10-20 MED ORDER — KETOROLAC TROMETHAMINE 30 MG/ML IJ SOLN
INTRAMUSCULAR | Status: AC
  Filled 2017-10-20: qty 1

## 2017-10-20 MED ORDER — MISOPROSTOL 200 MCG PO TABS
200.0000 ug | ORAL_TABLET | Freq: Once | ORAL | Status: AC
  Administered 2017-10-20: 200 ug via VAGINAL
  Filled 2017-10-20: qty 1

## 2017-10-20 MED ORDER — LIDOCAINE HCL (CARDIAC) PF 100 MG/5ML IV SOSY
PREFILLED_SYRINGE | INTRAVENOUS | Status: AC
  Filled 2017-10-20: qty 5

## 2017-10-20 MED ORDER — PROPOFOL 10 MG/ML IV BOLUS
INTRAVENOUS | Status: DC | PRN
  Administered 2017-10-20: 200 mg via INTRAVENOUS

## 2017-10-20 MED ORDER — OXYCODONE HCL 5 MG PO TABS: 5.0000 mg | ORAL_TABLET | Freq: Once | ORAL | Status: DC | PRN

## 2017-10-20 MED ORDER — LACTATED RINGERS IV SOLN
INTRAVENOUS | Status: DC
  Administered 2017-10-20 (×2): via INTRAVENOUS

## 2017-10-20 MED ORDER — ACETAMINOPHEN 325 MG PO TABS: 325.0000 mg | ORAL_TABLET | ORAL | Status: DC | PRN

## 2017-10-20 MED ORDER — SOD CITRATE-CITRIC ACID 500-334 MG/5ML PO SOLN
30.0000 mL | Freq: Once | ORAL | Status: AC
  Administered 2017-10-20: 30 mL via ORAL
  Filled 2017-10-20: qty 15

## 2017-10-20 MED ORDER — DEXAMETHASONE SODIUM PHOSPHATE 4 MG/ML IJ SOLN
INTRAMUSCULAR | Status: AC
  Filled 2017-10-20: qty 1

## 2017-10-20 MED ORDER — ONDANSETRON HCL 4 MG/2ML IJ SOLN
INTRAMUSCULAR | Status: DC | PRN
  Administered 2017-10-20: 4 mg via INTRAVENOUS

## 2017-10-20 MED ORDER — KETOROLAC TROMETHAMINE 30 MG/ML IJ SOLN
INTRAMUSCULAR | Status: DC | PRN
  Administered 2017-10-20: 30 mg via INTRAVENOUS

## 2017-10-20 MED ORDER — ACETAMINOPHEN 160 MG/5ML PO SOLN: 325.0000 mg | ORAL | Status: DC | PRN

## 2017-10-20 MED ORDER — PROPOFOL 10 MG/ML IV BOLUS
INTRAVENOUS | Status: AC
  Filled 2017-10-20: qty 20

## 2017-10-20 MED ORDER — MIDAZOLAM HCL 2 MG/2ML IJ SOLN
INTRAMUSCULAR | Status: DC | PRN
  Administered 2017-10-20: 2 mg via INTRAVENOUS

## 2017-10-20 MED ORDER — FENTANYL CITRATE (PF) 100 MCG/2ML IJ SOLN: 25.0000 ug | INTRAMUSCULAR | Status: DC | PRN

## 2017-10-20 MED ORDER — ONDANSETRON HCL 4 MG/2ML IJ SOLN
INTRAMUSCULAR | Status: AC
Start: 2017-10-20 — End: ?
  Filled 2017-10-20: qty 2

## 2017-10-20 MED ORDER — FENTANYL CITRATE (PF) 100 MCG/2ML IJ SOLN
INTRAMUSCULAR | Status: AC
  Filled 2017-10-20: qty 2

## 2017-10-20 MED ORDER — DEXAMETHASONE SODIUM PHOSPHATE 10 MG/ML IJ SOLN
INTRAMUSCULAR | Status: DC | PRN
  Administered 2017-10-20: 4 mg via INTRAVENOUS

## 2017-10-20 MED ORDER — LIDOCAINE HCL 2 % IJ SOLN
INTRAMUSCULAR | Status: DC | PRN
  Administered 2017-10-20: 20 mL

## 2017-10-20 MED ORDER — DOXYCYCLINE HYCLATE 100 MG PO TABS: 200.0000 mg | ORAL_TABLET | Freq: Once | ORAL | Status: DC

## 2017-10-20 MED ORDER — IBUPROFEN 600 MG PO TABS: 600.0000 mg | ORAL_TABLET | Freq: Four times a day (QID) | ORAL | 1 refills | Status: AC | PRN

## 2017-10-20 SURGICAL SUPPLY — 18 items
CATH ROBINSON RED A/P 16FR (CATHETERS) ×3 IMPLANT
DECANTER SPIKE VIAL GLASS SM (MISCELLANEOUS) ×3 IMPLANT
GLOVE BIO SURGEON STRL SZ 6.5 (GLOVE) ×2 IMPLANT
GLOVE BIO SURGEONS STRL SZ 6.5 (GLOVE) ×1
GLOVE BIOGEL PI IND STRL 7.0 (GLOVE) ×1 IMPLANT
GLOVE BIOGEL PI INDICATOR 7.0 (GLOVE) ×2
GOWN STRL REUS W/TWL LRG LVL3 (GOWN DISPOSABLE) ×6 IMPLANT
KIT BERKELEY 1ST TRIMESTER 3/8 (MISCELLANEOUS) ×3 IMPLANT
NS IRRIG 1000ML POUR BTL (IV SOLUTION) ×3 IMPLANT
PACK VAGINAL MINOR WOMEN LF (CUSTOM PROCEDURE TRAY) ×3 IMPLANT
PAD OB MATERNITY 4.3X12.25 (PERSONAL CARE ITEMS) ×3 IMPLANT
PAD PREP 24X48 CUFFED NSTRL (MISCELLANEOUS) ×3 IMPLANT
SET BERKELEY SUCTION TUBING (SUCTIONS) ×3 IMPLANT
TOWEL OR 17X24 6PK STRL BLUE (TOWEL DISPOSABLE) ×6 IMPLANT
VACURETTE 10 RIGID CVD (CANNULA) IMPLANT
VACURETTE 7MM CVD STRL WRAP (CANNULA) ×3 IMPLANT
VACURETTE 8 RIGID CVD (CANNULA) IMPLANT
VACURETTE 9 RIGID CVD (CANNULA) IMPLANT

## 2017-10-20 NOTE — H&P (Signed)
Heidi Delgado is an 27 y.o. female G3P1021 at 7 wk w incomplete SAB.  By LMP should be 14 weeks, but on [place] 7 wk with no FHTs.  D/w pt options for management.  Opts for D&C - d/w pt r/b/a  Pertinent Gynecological History:  OB History: G3, P1021 G1 SAB G2 SVD 7#5 3/16  No abn pap No STD  Menstrual History:  No LMP recorded. Patient is pregnant.    Past Medical History:  Diagnosis Date  . ADD (attention deficit disorder)   . Incomplete spontaneous abortion 10/20/2017  . No pertinent past medical history     Past Surgical History:  Procedure Laterality Date  . NO PAST SURGERIES      Family History  Problem Relation Age of Onset  . Other Neg Hx   . Cancer Other   . CAD Other   . Stroke Other   . Hypertension Other     Social History:  reports that she quit smoking about 4 years ago. She smoked 0.00 packs per day. She does not have any smokeless tobacco history on file. She reports that she does not drink alcohol or use drugs. single  Allergies:  Allergies  Allergen Reactions  . Codeine Shortness Of Breath and Nausea And Vomiting  . Vicodin [Hydrocodone-Acetaminophen] Shortness Of Breath and Nausea Only  . Ivp Dye [Iodinated Diagnostic Agents]     Medications Prior to Admission  Medication Sig Dispense Refill Last Dose  . acetaminophen (TYLENOL) 500 MG tablet Take 500 mg by mouth every 6 (six) hours as needed. pain   Past Month at Unknown time  . calcium carbonate (TUMS - DOSED IN MG ELEMENTAL CALCIUM) 500 MG chewable tablet Chew 1 tablet by mouth daily as needed for indigestion or heartburn.   Past Week at Unknown time  . ibuprofen (ADVIL,MOTRIN) 600 MG tablet Take 1 tablet (600 mg total) by mouth every 6 (six) hours. 30 tablet 0   . Prenatal Vit-Fe Fumarate-FA (PRENATAL MULTIVITAMIN) TABS tablet Take 1 tablet by mouth daily at 12 noon.   03/25/2014 at Unknown time    Review of Systems  Constitutional: Negative.   HENT: Negative.   Eyes: Negative.    Respiratory: Negative.   Cardiovascular: Negative.   Gastrointestinal: Negative.   Genitourinary: Negative.   Musculoskeletal: Negative.   Skin: Negative.   Neurological: Negative.   Psychiatric/Behavioral: Negative.     Blood pressure 122/67, pulse 85, temperature 98.3 F (36.8 C), temperature source Oral, resp. rate 17, height 5' (1.524 m), weight 70.5 kg, SpO2 100 %, unknown if currently breastfeeding. Physical Exam  Constitutional: She is oriented to person, place, and time. She appears well-developed and well-nourished.  HENT:  Head: Normocephalic and atraumatic.  Cardiovascular: Normal rate and regular rhythm.  Respiratory: Breath sounds normal. No respiratory distress. She has no wheezes.  GI: Soft. Bowel sounds are normal. She exhibits no distension. There is no tenderness.  Musculoskeletal: Normal range of motion.  Neurological: She is alert and oriented to person, place, and time.  Skin: Skin is warm and dry.  Psychiatric: She has a normal mood and affect. Her behavior is normal.   Blood type A+  [place] 7wk CRL, no FHTs  Assessment/Plan: 27to G3P1021 at 7+ no FHTs D&C Doxycycline for prophylaxis cytotec pv for cervical ripening D/w pt r/b/a of D&C - will proceed  Laraine Samet Bovard-Stuckert 10/20/2017, 3:11 PM

## 2017-10-20 NOTE — Anesthesia Procedure Notes (Signed)
Procedure Name: LMA Insertion Date/Time: 10/20/2017 4:46 PM Performed by: Elgie Congo, CRNA Pre-anesthesia Checklist: Patient identified, Emergency Drugs available, Suction available and Patient being monitored Patient Re-evaluated:Patient Re-evaluated prior to induction Oxygen Delivery Method: Circle system utilized Preoxygenation: Pre-oxygenation with 100% oxygen Induction Type: IV induction Ventilation: Mask ventilation without difficulty LMA: LMA inserted LMA Size: 3.0 Number of attempts: 1 Placement Confirmation: positive ETCO2 and breath sounds checked- equal and bilateral Tube secured with: Tape Dental Injury: Teeth and Oropharynx as per pre-operative assessment

## 2017-10-20 NOTE — Interval H&P Note (Signed)
History and Physical Interval Note:  10/20/2017 4:28 PM  [name]  has presented today for surgery, with the diagnosis of Heidi Delgado  The various methods of treatment have been discussed with the patient and family. After consideration of risks, benefits and other options for treatment, the patient has consented to  Procedure(s): DILATATION AND EVACUATION (N/A) as a surgical intervention .  The patient's history has been reviewed, patient examined, no change in status, stable for surgery.  I have reviewed the patient's chart and labs.  Questions were answered to the patient's satisfaction.     Aleyda Gindlesperger Bovard-Stuckert

## 2017-10-20 NOTE — MAU Note (Signed)
MAB, sent in for procedure, denies pain or bleeding.  States is doing ok.

## 2017-10-20 NOTE — Anesthesia Preprocedure Evaluation (Addendum)
Anesthesia Evaluation  Patient identified by MRN, date of birth, ID band Patient awake    Reviewed: Allergy & Precautions, H&P , NPO status , Patient's Chart, lab work & pertinent test results, reviewed documented beta blocker date and time   Airway Mallampati: I  TM Distance: >3 FB Neck ROM: full    Dental no notable dental hx. (+) Teeth Intact   Pulmonary Current Smoker, former smoker,    Pulmonary exam normal breath sounds clear to auscultation       Cardiovascular Exercise Tolerance: Good negative cardio ROS   Rhythm:regular Rate:Normal     Neuro/Psych PSYCHIATRIC DISORDERS negative neurological ROS     GI/Hepatic negative GI ROS, Neg liver ROS,   Endo/Other  negative endocrine ROS  Renal/GU negative Renal ROS  negative genitourinary   Musculoskeletal negative musculoskeletal ROS (+)   Abdominal   Peds negative pediatric ROS (+)  Hematology negative hematology ROS (+)   Anesthesia Other Findings   Reproductive/Obstetrics (+) Pregnancy                           Anesthesia Physical  Anesthesia Plan  ASA: II and emergent  Anesthesia Plan: General   Post-op Pain Management:    Induction: Intravenous  PONV Risk Score and Plan: 1 and Treatment may vary due to age or medical condition and Ondansetron  Airway Management Planned: LMA  Additional Equipment:   Intra-op Plan:   Post-operative Plan:   Informed Consent: I have reviewed the patients History and Physical, chart, labs and discussed the procedure including the risks, benefits and alternatives for the proposed anesthesia with the patient or authorized representative who has indicated his/her understanding and acceptance.   Dental Advisory Given  Plan Discussed with: CRNA, Surgeon and Anesthesiologist  Anesthesia Plan Comments: ( )      Anesthesia Quick Evaluation

## 2017-10-20 NOTE — Anesthesia Postprocedure Evaluation (Signed)
Anesthesia Post Note  Patient: Heidi Delgado  Procedure(s) Performed: DILATATION AND EVACUATION (N/A Vagina )     Patient location during evaluation: PACU Anesthesia Type: General Level of consciousness: awake and alert Pain management: pain level controlled Vital Signs Assessment: post-procedure vital signs reviewed and stable Respiratory status: spontaneous breathing, nonlabored ventilation, respiratory function stable and patient connected to nasal cannula oxygen Cardiovascular status: blood pressure returned to baseline and stable Postop Assessment: no apparent nausea or vomiting Anesthetic complications: no    Last Vitals:  Vitals:   10/20/17 1451  BP: 122/67  Pulse: 85  Resp: 17  Temp: 36.8 C  SpO2: 100%    Last Pain:  Vitals:   10/20/17 1451  TempSrc: Oral   Pain Goal:                 [name]

## 2017-10-20 NOTE — Brief Op Note (Signed)
10/20/2017  5:11 PM  PATIENT:  Heidi Delgado  27 y.o. female  PRE-OPERATIVE DIAGNOSIS:  Missed AB  POST-OPERATIVE DIAGNOSIS:  missed AB  PROCEDURE:  Procedure(s): DILATATION AND EVACUATION (N/A)  SURGEON:  Surgeon(s) and Role:    * Bovard-Stuckert, Liann Spaeth, MD - Primary  ANESTHESIA:   general by LMA  EBL:  50 mL uop 100cc IVF per anesthesia  BLOOD ADMINISTERED:none  DRAINS: none   LOCAL MEDICATIONS USED:  LIDOCAINE   SPECIMEN:  Source of Specimen:  POC  DISPOSITION OF SPECIMEN:  PATHOLOGY  COUNTS:  YES  TOURNIQUET:  * No tourniquets in log *  DICTATION: .Other Dictation: Dictation Number [id number]  PLAN OF CARE: Discharge to home after PACU  PATIENT DISPOSITION:  PACU - hemodynamically stable.   Delay start of Pharmacological VTE agent (>24hrs) due to surgical blood loss or risk of bleeding: not applicable

## 2017-10-20 NOTE — Discharge Instructions (Signed)
°  Post Anesthesia Home Care Instructions ° °Activity: °Get plenty of rest for the remainder of the day. A responsible individual must stay with you for 24 hours following the procedure.  °For the next 24 hours, DO NOT: °-Drive a car °-Operate machinery °-Drink alcoholic beverages °-Take any medication unless instructed by your physician °-Make any legal decisions or sign important papers. ° °Meals: °Start with liquid foods such as gelatin or soup. Progress to regular foods as tolerated. Avoid greasy, spicy, heavy foods. If nausea and/or vomiting occur, drink only clear liquids until the nausea and/or vomiting subsides. Call your physician if vomiting continues. ° °Special Instructions/Symptoms: °Your throat may feel dry or sore from the anesthesia or the breathing tube placed in your throat during surgery. If this causes discomfort, gargle with warm salt water. The discomfort should disappear within 24 hours. ° °If you had a scopolamine patch placed behind your ear for the management of post- operative nausea and/or vomiting: ° °1. The medication in the patch is effective for 72 hours, after which it should be removed.  Wrap patch in a tissue and discard in the trash. Wash hands thoroughly with soap and water. °2. You may remove the patch earlier than 72 hours if you experience unpleasant side effects which may include dry mouth, dizziness or visual disturbances. °3. Avoid touching the patch. Wash your hands with soap and water after contact with the patch. °  °DISCHARGE INSTRUCTIONS: D&C / D&E °The following instructions have been prepared to help you care for yourself upon your return home. °  °Personal hygiene: °• Use sanitary pads for vaginal drainage, not tampons. °• Shower the day after your procedure. °• NO tub baths, pools or Jacuzzis for 2-3 weeks. °• Wipe front to back after using the bathroom. ° °Activity and limitations: °• Do NOT drive or operate any equipment for 24 hours. The effects of anesthesia are  still present and drowsiness may result. °• Do NOT rest in bed all day. °• Walking is encouraged. °• Walk up and down stairs slowly. °• You may resume your normal activity in one to two days or as indicated by your physician. ° °Sexual activity: NO intercourse for at least 2 weeks after the procedure, or as indicated by your physician. ° °Diet: Eat a light meal as desired this evening. You may resume your usual diet tomorrow. ° °Return to work: You may resume your work activities in one to two days or as indicated by your doctor. ° °What to expect after your surgery: Expect to have vaginal bleeding/discharge for 2-3 days and spotting for up to 10 days. It is not unusual to have soreness for up to 1-2 weeks. You may have a slight burning sensation when you urinate for the first day. Mild cramps may continue for a couple of days. You may have a regular period in 2-6 weeks. ° °Call your doctor for any of the following: °• Excessive vaginal bleeding, saturating and changing one pad every hour. °• Inability to urinate 6 hours after discharge from hospital. °• Pain not relieved by pain medication. °• Fever of 100.4° F or greater. °• Unusual vaginal discharge or odor. ° ° Call for an appointment:  ° ° °Patient’s signature: ______________________ ° °Nurse’s signature ________________________ ° °Support person's signature_______________________ ° ° ° °

## 2017-10-20 NOTE — Op Note (Signed)
Heidi Delgado, Heidi Delgado MEDICAL RECORD ZO:[id number] ACCOUNT [id number] DATE OF BIRTH:Apr 21, 1990 FACILITY: [facility] LOCATION: [facility] PHYSICIAN:Graeden Bitner Hinton Rao, MD  OPERATIVE REPORT  DATE OF PROCEDURE:  10/20/2017  PREOPERATIVE DIAGNOSES:  Missed abortion.  POSTOPERATIVE DIAGNOSIS:  Missed abortion.  PROCEDURE:  Suction D and C.  SURGEON:  Sherian Rein, MD  ANESTHESIA:  General by LMA.  ESTIMATED BLOOD LOSS:  Approximately 100 mL.  URINE OUTPUT:  100 mL.  IV FLUIDS:  Per anesthesia.  COMPLICATIONS:  None.  PATHOLOGY:  Products of conception sent to pathology.  DESCRIPTION OF PROCEDURE:  After informed consent was reviewed with the patient and her partner including risks, benefits and alternatives of the procedure, she was transported to the operating room and placed on the table in supine position, then placed  in the [place] stirrups.  General anesthesia was induced and found to be adequate.  An LMA was placed.  Appropriate timeout was performed.  She was then prepped and draped in the normal sterile fashion.  Her bladder was sterilely drained.  Using an  open-sided speculum, her cervix was easily visualized, and a paracervical block with 20 mL of 2% lidocaine was placed.  Her uterus was noted to be retroverted.  Her cervix was dilated with Hegar dilators to 21, and a 7-French curette was placed into her  uterus with several passes.  The products of conception were removed from her cervix.  After several passes, appropriate tissue had been removed.  The curette was then removed.  The tenaculum was removed from her cervix, and bleeding was noted to be  within normal limits.  The speculum was removed.  The patient was returned to supine position, awakened in stable condition, transferred to the PACU.  Sponge, lap and needle counts were correct x2 per the operating staff.  LN/NUANCE  D:10/20/2017 T:10/20/2017 JOB:[id number]

## 2017-10-20 NOTE — Transfer of Care (Signed)
Immediate Anesthesia Transfer of Care Note  Patient: Heidi Delgado  Procedure(s) Performed: DILATATION AND EVACUATION (N/A Vagina )  Patient Location: PACU  Anesthesia Type:General  Level of Consciousness: awake, alert  and oriented  Airway & Oxygen Therapy: Patient Spontanous Breathing and Patient connected to nasal cannula oxygen  Post-op Assessment: Report given to RN, Post -op Vital signs reviewed and stable and Patient moving all extremities  Post vital signs: Reviewed and stable  Last Vitals:  Vitals Value Taken Time  BP    Temp    Pulse    Resp    SpO2      Last Pain:  Vitals:   10/20/17 1451  TempSrc: Oral         Complications: No apparent anesthesia complications

## 2017-10-21 ENCOUNTER — Encounter (HOSPITAL_COMMUNITY): Payer: Self-pay | Admitting: Obstetrics and Gynecology

## 2018-02-27 ENCOUNTER — Other Ambulatory Visit: Payer: Self-pay

## 2018-02-27 ENCOUNTER — Emergency Department (HOSPITAL_COMMUNITY)
Admission: EM | Admit: 2018-02-27 | Discharge: 2018-02-27 | Disposition: A | Discharge status: Home / Self Care | Payer: Medicaid Other | Attending: Emergency Medicine | Admitting: Emergency Medicine

## 2018-02-27 ENCOUNTER — Encounter (HOSPITAL_COMMUNITY): Payer: Self-pay | Admitting: Emergency Medicine

## 2018-02-27 ENCOUNTER — Emergency Department (HOSPITAL_COMMUNITY): Payer: Medicaid Other

## 2018-02-27 DIAGNOSIS — F17210 Nicotine dependence, cigarettes, uncomplicated: Secondary | ICD-10-CM | POA: Diagnosis not present

## 2018-02-27 DIAGNOSIS — Z79899 Other long term (current) drug therapy: Secondary | ICD-10-CM | POA: Insufficient documentation

## 2018-02-27 DIAGNOSIS — R1031 Right lower quadrant pain: Secondary | ICD-10-CM | POA: Diagnosis present

## 2018-02-27 DIAGNOSIS — R319 Hematuria, unspecified: Secondary | ICD-10-CM | POA: Insufficient documentation

## 2018-02-27 DIAGNOSIS — N200 Calculus of kidney: Secondary | ICD-10-CM | POA: Insufficient documentation

## 2018-02-27 LAB — CBC WITH DIFFERENTIAL/PLATELET
Abs Immature Granulocytes: 0.07 10*3/uL (ref 0.00–0.07)
BASOS ABS: 0 10*3/uL (ref 0.0–0.1)
Basophils Relative: 0 %
EOS ABS: 0.1 10*3/uL (ref 0.0–0.5)
EOS PCT: 1 %
HCT: 47.4 % — ABNORMAL HIGH (ref 36.0–46.0)
Hemoglobin: 15.9 g/dL — ABNORMAL HIGH (ref 12.0–15.0)
Immature Granulocytes: 1 %
LYMPHS ABS: 2.9 10*3/uL (ref 0.7–4.0)
Lymphocytes Relative: 26 %
MCH: 28.4 pg (ref 26.0–34.0)
MCHC: 33.5 g/dL (ref 30.0–36.0)
MCV: 84.6 fL (ref 80.0–100.0)
MONO ABS: 0.7 10*3/uL (ref 0.1–1.0)
Monocytes Relative: 6 %
NRBC: 0 % (ref 0.0–0.2)
Neutro Abs: 7.2 10*3/uL (ref 1.7–7.7)
Neutrophils Relative %: 66 %
Platelets: 224 10*3/uL (ref 150–400)
RBC: 5.6 MIL/uL — ABNORMAL HIGH (ref 3.87–5.11)
RDW: 12.5 % (ref 11.5–15.5)
WBC: 11 10*3/uL — ABNORMAL HIGH (ref 4.0–10.5)

## 2018-02-27 LAB — URINALYSIS, ROUTINE W REFLEX MICROSCOPIC
BACTERIA UA: NONE SEEN
Bilirubin Urine: NEGATIVE
Glucose, UA: NEGATIVE mg/dL
Ketones, ur: NEGATIVE mg/dL
NITRITE: NEGATIVE
PROTEIN: 30 mg/dL — AB
RBC / HPF: >50 RBC/hpf — ABNORMAL HIGH (ref 0–5)
SPECIFIC GRAVITY, URINE: 1.023 (ref 1.005–1.030)
pH: 5 (ref 5.0–8.0)

## 2018-02-27 LAB — COMPREHENSIVE METABOLIC PANEL
ALT: 41 U/L (ref 0–44)
ANION GAP: 11 (ref 5–15)
AST: 26 U/L (ref 15–41)
Albumin: 5.1 g/dL — ABNORMAL HIGH (ref 3.5–5.0)
Alkaline Phosphatase: 85 U/L (ref 38–126)
BUN: 12 mg/dL (ref 6–20)
CALCIUM: 9.6 mg/dL (ref 8.9–10.3)
CHLORIDE: 104 mmol/L (ref 98–111)
CO2: 21 mmol/L — ABNORMAL LOW (ref 22–32)
CREATININE: 0.8 mg/dL (ref 0.44–1.00)
GFR calc Af Amer: >60 mL/min (ref >60)
GLUCOSE: 114 mg/dL — AB (ref 70–99)
POTASSIUM: 3.7 mmol/L (ref 3.5–5.1)
Sodium: 136 mmol/L (ref 135–145)
TOTAL PROTEIN: 8.6 g/dL — AB (ref 6.5–8.1)
Total Bilirubin: 0.7 mg/dL (ref 0.3–1.2)

## 2018-02-27 LAB — LIPASE, BLOOD: Lipase: 24 U/L (ref 11–51)

## 2018-02-27 LAB — I-STAT BETA HCG BLOOD, ED (MC, WL, AP ONLY)

## 2018-02-27 MED ORDER — SODIUM CHLORIDE 0.9 % IV BOLUS
1000.0000 mL | Freq: Once | INTRAVENOUS | Status: AC
  Administered 2018-02-27: 1000 mL via INTRAVENOUS

## 2018-02-27 MED ORDER — TRAMADOL HCL 50 MG PO TABS: 50.0000 mg | ORAL_TABLET | Freq: Four times a day (QID) | ORAL | 0 refills | Status: AC | PRN

## 2018-02-27 MED ORDER — ONDANSETRON HCL 4 MG/2ML IJ SOLN
4.0000 mg | Freq: Once | INTRAMUSCULAR | Status: AC
  Administered 2018-02-27: 4 mg via INTRAVENOUS
  Filled 2018-02-27: qty 2

## 2018-02-27 MED ORDER — SODIUM CHLORIDE 0.9 % IV SOLN
INTRAVENOUS | Status: DC
  Administered 2018-02-27: 10:00:00 via INTRAVENOUS

## 2018-02-27 MED ORDER — KETOROLAC TROMETHAMINE 30 MG/ML IJ SOLN
15.0000 mg | Freq: Once | INTRAMUSCULAR | Status: AC
  Administered 2018-02-27: 15 mg via INTRAVENOUS
  Filled 2018-02-27: qty 1

## 2018-02-27 MED ORDER — FENTANYL CITRATE (PF) 100 MCG/2ML IJ SOLN
25.0000 ug | Freq: Once | INTRAMUSCULAR | Status: AC
  Administered 2018-02-27: 25 ug via INTRAVENOUS
  Filled 2018-02-27: qty 2

## 2018-02-27 NOTE — ED Triage Notes (Signed)
Pt c/o R abdominal pain radiating to R flank. Pt c/o hematuria and severe pain.

## 2018-02-27 NOTE — ED Provider Notes (Signed)
[facility]-EMERGENCY DEPT Provider Note   CSN: [id number] Arrival date & time: 02/27/18  0801    History   Chief Complaint Chief Complaint  Patient presents with  . Abdominal Pain  . Flank Pain  . Hematuria    HPI Heidi Delgado is a 28 y.o. female.     HPI  Patient presents with concern of flank pain. She notes over the past week she has had some episodes of pain, though nothing severe, nor sustained until last night. No over the past 12 hours or so she has had severe sharp pain throughout the right lateral abdomen, flank and anterior. There is associated difficulty with urination, nausea, vomiting. She is unsure if she has had a fever. No other new complaints. Patient states that she is generally well has no history of abdominal surgery, does have a DNC procedure in the recent past, 3 months ago, noted to be uncomplicated. Since onset about 1 week ago no relief with OTC medication.  Past Medical History:  Diagnosis Date  . ADD (attention deficit disorder)   . Incomplete spontaneous abortion 10/20/2017  . No pertinent past medical history   . S/P dilatation and curettage 10/20/2017    Patient Active Problem List   Diagnosis Date Noted  . Incomplete spontaneous abortion 10/20/2017  . S/P dilatation and curettage 10/20/2017  . SVD (spontaneous vaginal delivery) 03/27/2014  . PROM (premature rupture of membranes) 03/26/2014    Past Surgical History:  Procedure Laterality Date  . DILATION AND EVACUATION N/A 10/20/2017   Procedure: DILATATION AND EVACUATION;  Surgeon: Sherian Rein, MD;  Location: [facility];  Service: Gynecology;  Laterality: N/A;  . NO PAST SURGERIES       OB History    Gravida  4   Para  1   Term  1   Preterm      AB  2   Living  1     SAB  1   TAB  1   Ectopic      Multiple  0   Live Births  1            Home Medications    Prior to Admission medications   Medication Sig Start Date End  Date Taking? Authorizing Provider  medroxyPROGESTERone Acetate 150 MG/ML SUSY Inject 1 mL into the muscle every 3 (three) months. 11/11/17  Yes [provider]  ibuprofen (ADVIL,MOTRIN) 600 MG tablet Take 1 tablet (600 mg total) by mouth every 6 (six) hours as needed for moderate pain. Patient not taking: Reported on 02/27/2018 10/20/17   Sherian Rein, MD    Family History Family History  Problem Relation Age of Onset  . Cancer Other   . CAD Other   . Stroke Other   . Hypertension Other   . Other Neg Hx     Social History Social History   Tobacco Use  . Smoking status: Current Every Day Smoker    Packs/day: 0.00    Last attempt to quit: 09/25/2013    Years since quitting: 4.4  . Smokeless tobacco: Never Used  Substance Use Topics  . Alcohol use: No    Comment: occ  . Drug use: No     Allergies   Codeine; Vicodin [hydrocodone-acetaminophen]; and Ivp dye [iodinated diagnostic agents]   Review of Systems Review of Systems  Constitutional:       Per HPI, otherwise negative  HENT:       Per HPI, otherwise negative  Respiratory:  Per HPI, otherwise negative  Cardiovascular:       Per HPI, otherwise negative  Gastrointestinal: Positive for abdominal pain, nausea and vomiting.  Endocrine:       Negative aside from HPI  Genitourinary:       Neg aside from HPI   Musculoskeletal:       Per HPI, otherwise negative  Skin: Negative.   Neurological: Negative for syncope.     Physical Exam Updated Vital Signs BP 118/76   Pulse 86   Temp 98 F (36.7 C) (Oral)   Resp 18   Ht 5' (1.524 m)   Wt 72.6 kg   LMP 02/13/2018 (Approximate)   SpO2 100%   Breastfeeding Unknown   BMI 31.25 kg/m   Physical Exam Vitals signs and nursing note reviewed.  Constitutional:      General: She is not in acute distress.    Appearance: She is well-developed.     Comments: Uncomfortable appearing young female in right lateral decubitus position.  HENT:      Head: Normocephalic and atraumatic.  Eyes:     Conjunctiva/sclera: Conjunctivae normal.  Cardiovascular:     Rate and Rhythm: Regular rhythm. Tachycardia present.  Pulmonary:     Effort: Pulmonary effort is normal. No respiratory distress.     Breath sounds: Normal breath sounds. No stridor.  Abdominal:     General: There is no distension.     Tenderness: There is abdominal tenderness in the right upper quadrant, right lower quadrant, periumbilical area, suprapubic area, left upper quadrant and left lower quadrant.  Skin:    General: Skin is warm and dry.  Neurological:     Mental Status: She is alert and oriented to person, place, and time.     Cranial Nerves: No cranial nerve deficit.      ED Treatments / Results  Labs (all labs ordered are listed, but only abnormal results are displayed) Labs Reviewed  COMPREHENSIVE METABOLIC PANEL - Abnormal; Notable for the following components:      Result Value   CO2 21 (*)    Glucose, Bld 114 (*)    Total Protein 8.6 (*)    Albumin 5.1 (*)    All other components within normal limits  CBC WITH DIFFERENTIAL/PLATELET - Abnormal; Notable for the following components:   WBC 11.0 (*)    RBC 5.60 (*)    Hemoglobin 15.9 (*)    HCT 47.4 (*)    All other components within normal limits  URINALYSIS, ROUTINE W REFLEX MICROSCOPIC - Abnormal; Notable for the following components:   Color, Urine AMBER (*)    APPearance CLOUDY (*)    Hgb urine dipstick LARGE (*)    Protein, ur 30 (*)    Leukocytes,Ua TRACE (*)    RBC / HPF >50 (*)    All other components within normal limits  LIPASE, BLOOD  I-STAT BETA HCG BLOOD, ED ([facility], [facility], [facility] ONLY)    EKG None  Radiology Ct Renal Stone Study  Result Date: 02/27/2018 CLINICAL DATA:  Right flank pain for 1 week. Hematuria this morning. Decreased urine output. EXAM: CT ABDOMEN AND PELVIS WITHOUT CONTRAST TECHNIQUE: Multidetector CT imaging of the abdomen and pelvis was performed following the standard  protocol without IV contrast. COMPARISON:  None. FINDINGS: Lower chest: Clear lung bases. Normal heart size without pericardial or pleural effusion. Incompletely imaged 11 mm nodule in the left breast on image 1/2 is most likely a lymph node, given patient age. Hepatobiliary: Normal liver.  Normal gallbladder, without biliary ductal dilatation. Pancreas: Normal, without mass or ductal dilatation. Spleen: Normal in size, without focal abnormality. Adrenals/Urinary Tract: Normal adrenal glands. No renal calculi. Mild left-sided and possible mild right-sided hydronephrosis. Left greater than right mild hydroureter, followed to the level of a left-sided ureterovesicular junction calculus of 5 mm on image 77/2 and a right-sided bladder base stone of 5 mm on image 78/2. Stomach/Bowel: Normal stomach, without wall thickening. Normal colon, appendix, and terminal ileum. Normal small bowel. Vascular/Lymphatic: Normal caliber of the aorta and branch vessels. No abdominopelvic adenopathy. Reproductive: Normal uterus and adnexa. Other: No significant free fluid. Musculoskeletal: No acute osseous abnormality. IMPRESSION: 1. 5 mm left ureterovesicular junction calculus with mild left hydronephrosis. 2. Right-sided bladder base stone. Mild right hydroureter and equivocal mild right hydronephrosis. 3. Incompletely imaged left breast nodule is most likely a lymph node, given patient age. Consider physical exam correlation. Electronically Signed   By: Jeronimo Greaves M.D.   On: 02/27/2018 11:10    Procedures Procedures (including critical care time)  Medications Ordered in ED Medications  sodium chloride 0.9 % bolus 1,000 mL (0 mLs Intravenous Stopped 02/27/18 1002)    And  0.9 %  sodium chloride infusion ( Intravenous New Bag/Given 02/27/18 1001)  fentaNYL (SUBLIMAZE) injection 25 mcg (25 mcg Intravenous Given 02/27/18 0855)  ondansetron (ZOFRAN) injection 4 mg (4 mg Intravenous Given 02/27/18 0855)  ketorolac (TORADOL) 30  MG/ML injection 15 mg (15 mg Intravenous Given 02/27/18 0855)     Initial Impression / Assessment and Plan / ED Course  I have reviewed the triage vital signs and the nursing notes.  Pertinent labs & imaging results that were available during my care of the patient were reviewed by me and considered in my medical decision making (see chart for details).       1:44 PM Patient in no distress, sitting upright. She has been able to urinate, though there was a delay in obtaining a urine sample. She feels better, is hemodynamically unremarkable. Reviewed labs, urinalysis, CT with the patient. There is evidence for stone, though no evidence for infection, nor complete obstruction. With improvement here, hemodynamic stability, the patient discharged in stable condition with analgesia, outpatient urology follow-up.   Final Clinical Impressions(s) / ED Diagnoses   Final diagnoses:  Kidney stone    ED Discharge Orders         Ordered    traMADol (ULTRAM) 50 MG tablet  Every 6 hours PRN     02/27/18 1346           Gerhard Munch, MD 02/27/18 1346

## 2018-02-27 NOTE — Discharge Instructions (Signed)
As discussed, with kidney stones it is important that you monitor your condition carefully and do not hesitate to return for concerning changes in your condition. Please use ibuprofen, 600 mg, 3 times daily and at the prescribed pain medication for relief.

## 2018-02-27 NOTE — ED Notes (Signed)
Bed: WA03 Expected date:  Expected time:  Means of arrival:  Comments: 

## 2019-01-06 DEATH — deceased

## 2020-03-10 IMAGING — CT CT RENAL STONE PROTOCOL
2 of 4 series · 16 of 46 positions shown, 18 images · non-contrast
Comparison: None.

CLINICAL DATA: Right flank pain for 1 week. Hematuria this morning.
Decreased urine output.

EXAM:
CT ABDOMEN AND PELVIS WITHOUT CONTRAST
TECHNIQUE: Multidetector CT imaging of the abdomen and pelvis was performed
following the standard protocol without IV contrast.

[Series 2: axial st · axial · 0.70mm/px · z∈[-474,-69]mm · 13 of 93 slices shown, 15 images]
[im 6/93  soft-tissue]
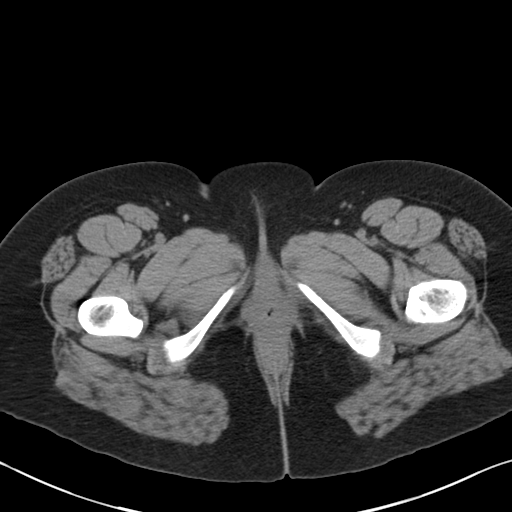
[im 6/93  bone]
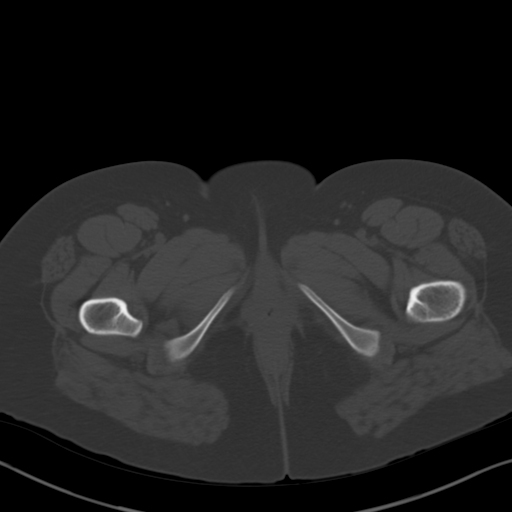
[im 11/93  soft-tissue]
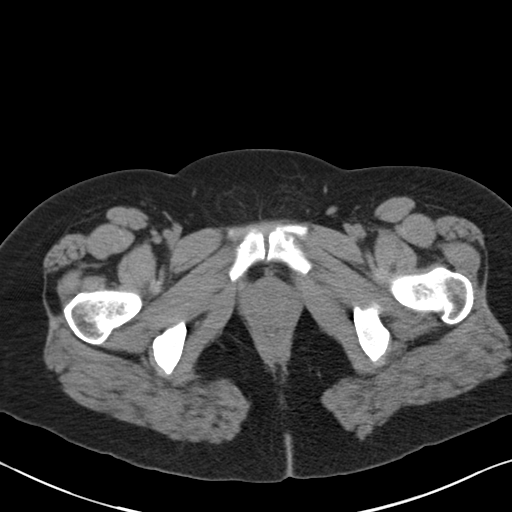
[im 22/93  soft-tissue]
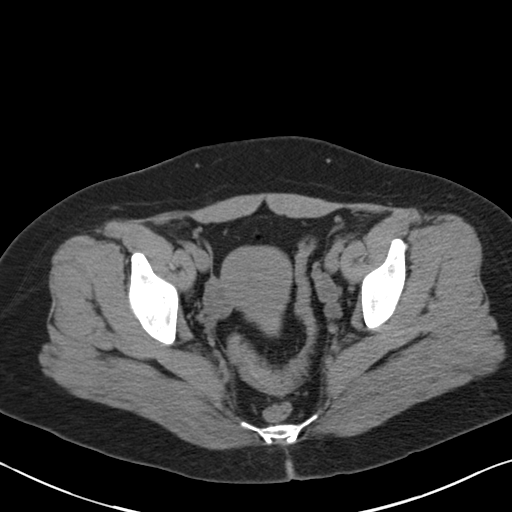
[im 28/93  soft-tissue]
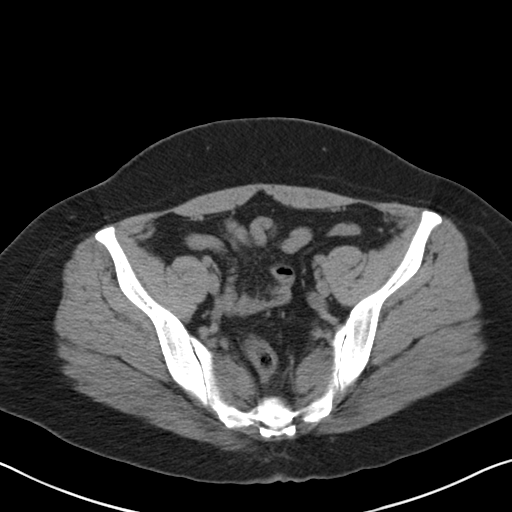
[im 33/93  soft-tissue]
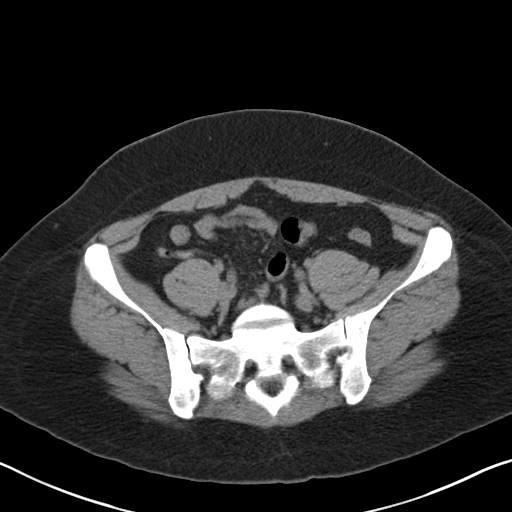
[im 38/93  soft-tissue]
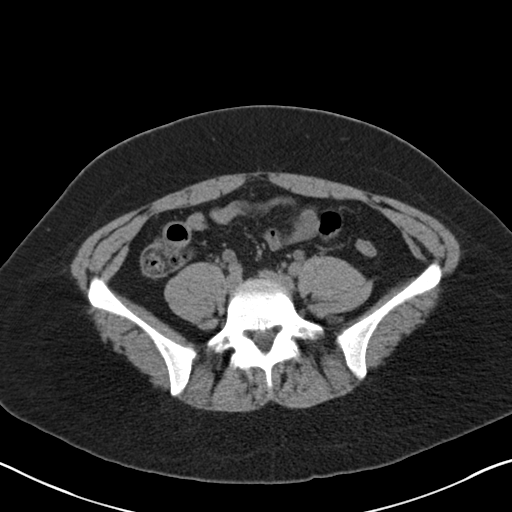
[im 49/93  soft-tissue]
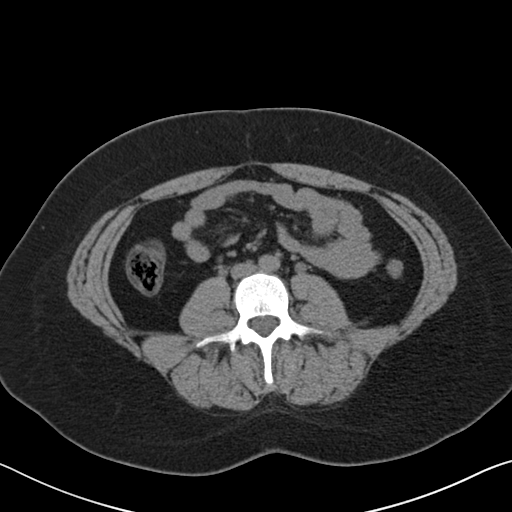
[im 55/93  soft-tissue]
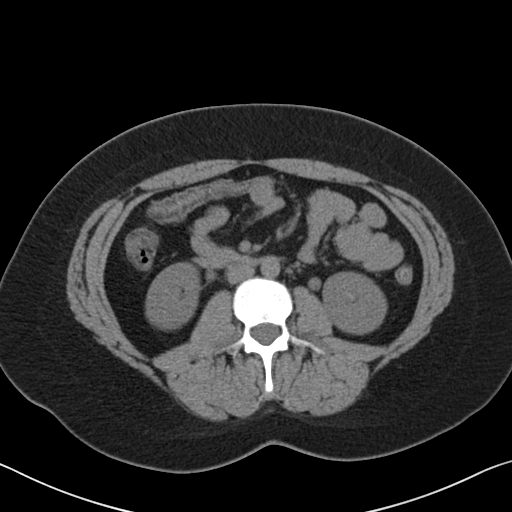
[im 60/93  soft-tissue]
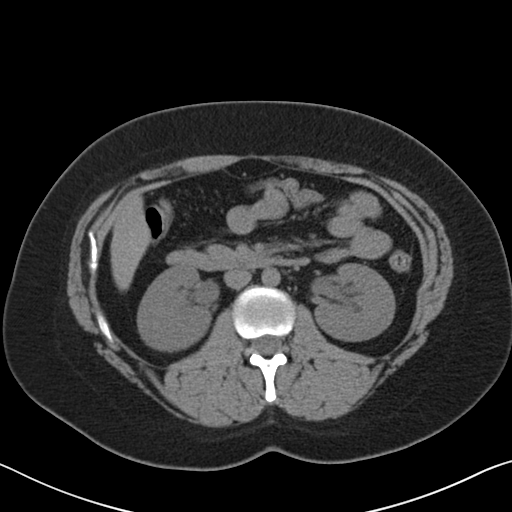
[im 60/93  bone]
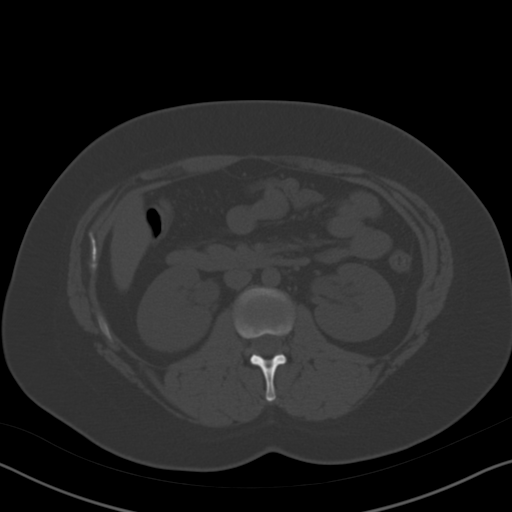
[im 65/93  soft-tissue]
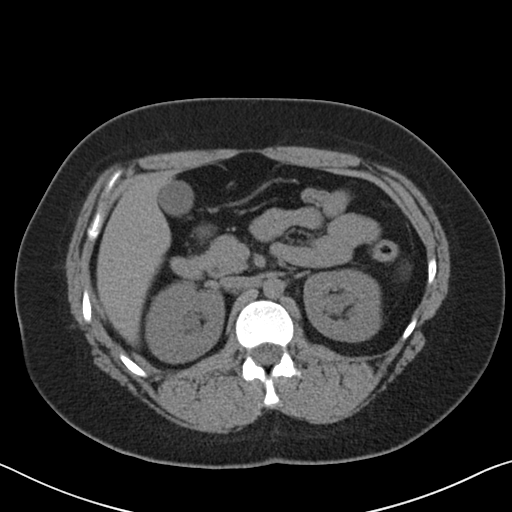
[im 71/93  soft-tissue]
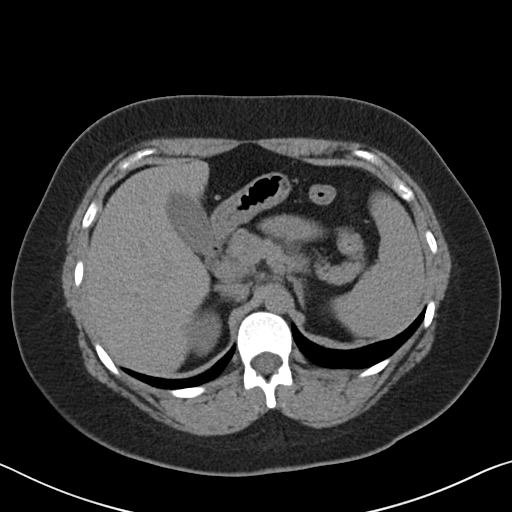
[im 82/93  soft-tissue]
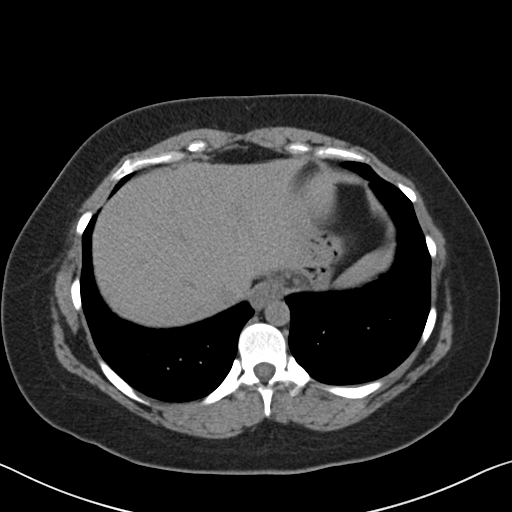
[im 87/93  soft-tissue]
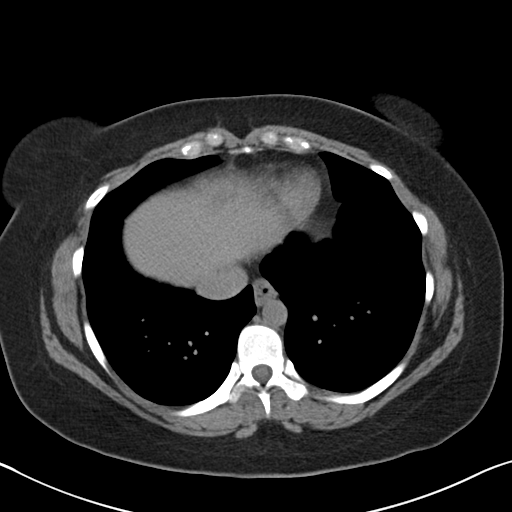

[Series 5: coronal · coronal · 0.70mm/px · 3 of 140 slices shown]
[im 47/140  soft-tissue]
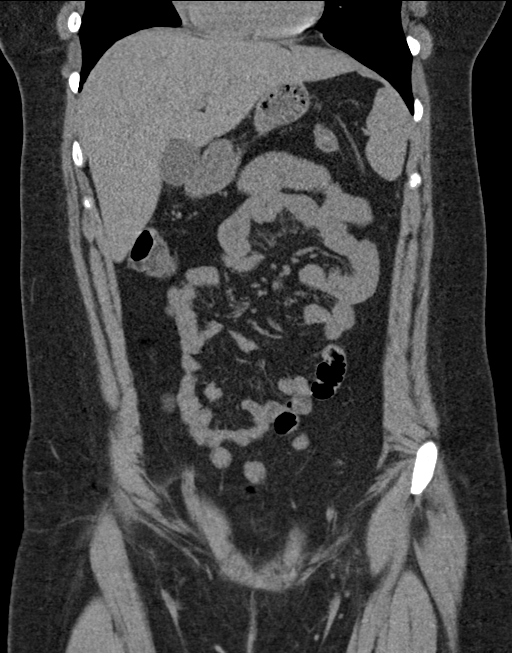
[im 62/140  soft-tissue]
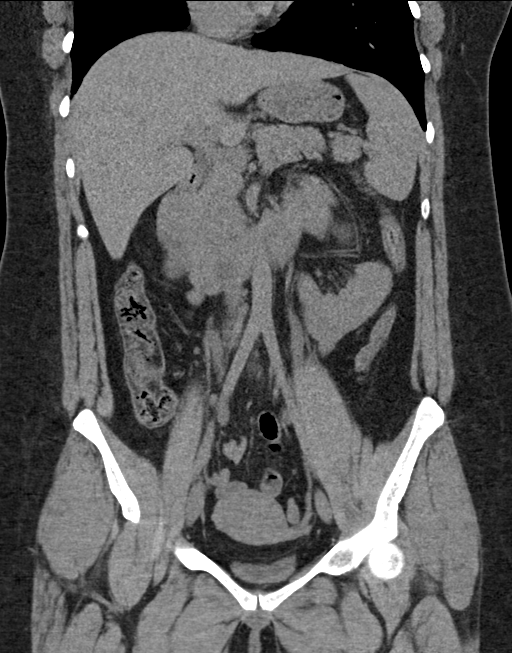
[im 78/140  soft-tissue]
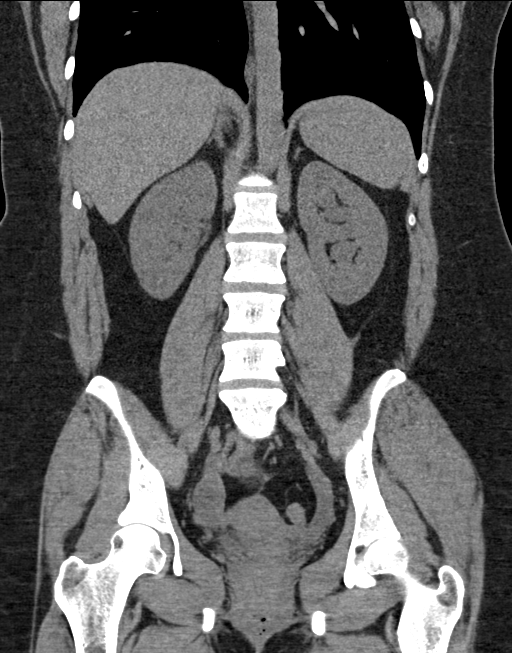

[16 of 46 positions shown; findings below may reference images not displayed]

FINDINGS: Lower chest: Clear lung bases. Normal heart size without pericardial
or pleural effusion. Incompletely imaged 11 mm nodule in the left
breast on image [DATE] is most likely a lymph node, given patient age.

Hepatobiliary: Normal liver. Normal gallbladder, without biliary
ductal dilatation.

Pancreas: Normal, without mass or ductal dilatation.

Spleen: Normal in size, without focal abnormality.

Adrenals/Urinary Tract: Normal adrenal glands. No renal calculi.
Mild left-sided and possible mild right-sided hydronephrosis. Left
greater than right mild hydroureter, followed to the level of a
left-sided ureterovesicular junction calculus of 5 mm on image 77/2
and a right-sided bladder base stone of 5 mm on image 78/2.

Stomach/Bowel: Normal stomach, without wall thickening. Normal
colon, appendix, and terminal ileum. Normal small bowel.

Vascular/Lymphatic: Normal caliber of the aorta and branch vessels.
No abdominopelvic adenopathy.

Reproductive: Normal uterus and adnexa.

Other: No significant free fluid.

Musculoskeletal: No acute osseous abnormality.
IMPRESSION: 1. 5 mm left ureterovesicular junction calculus with mild left
hydronephrosis.
2. Right-sided bladder base stone. Mild right hydroureter and
equivocal mild right hydronephrosis.
3. Incompletely imaged left breast nodule is most likely a lymph
node, given patient age. Consider physical exam correlation.
